# Patient Record
Sex: Female | Born: 1935
Health system: Southern US, Community
[De-identification: ages and names within clinical notes are randomized; demographics above are authoritative.]

## PROBLEM LIST (undated history)

## (undated) DIAGNOSIS — I2510 Atherosclerotic heart disease of native coronary artery without angina pectoris: Secondary | ICD-10-CM

## (undated) DIAGNOSIS — K219 Gastro-esophageal reflux disease without esophagitis: Secondary | ICD-10-CM

## (undated) DIAGNOSIS — I10 Essential (primary) hypertension: Secondary | ICD-10-CM

## (undated) DIAGNOSIS — I4891 Unspecified atrial fibrillation: Secondary | ICD-10-CM

## (undated) DIAGNOSIS — Z8719 Personal history of other diseases of the digestive system: Secondary | ICD-10-CM

## (undated) DIAGNOSIS — E785 Hyperlipidemia, unspecified: Secondary | ICD-10-CM

## (undated) DIAGNOSIS — I509 Heart failure, unspecified: Secondary | ICD-10-CM

## (undated) DIAGNOSIS — Z8673 Personal history of transient ischemic attack (TIA), and cerebral infarction without residual deficits: Secondary | ICD-10-CM

## (undated) DIAGNOSIS — I219 Acute myocardial infarction, unspecified: Secondary | ICD-10-CM

## (undated) HISTORY — PX: JOINT REPLACEMENT: SHX530

## (undated) HISTORY — PX: COLONOSCOPY WITH ESOPHAGOGASTRODUODENOSCOPY (EGD): SHX5779

## (undated) HISTORY — PX: CORONARY ANGIOPLASTY WITH STENT PLACEMENT: SHX49

---

## 2000-09-23 ENCOUNTER — Encounter: Payer: Self-pay | Admitting: Cardiology

## 2000-09-23 ENCOUNTER — Inpatient Hospital Stay (HOSPITAL_COMMUNITY): Admission: EM | Admit: 2000-09-23 | Discharge: 2000-09-25 | Payer: Self-pay | Admitting: Emergency Medicine

## 2000-09-24 ENCOUNTER — Encounter: Payer: Self-pay | Admitting: Cardiology

## 2000-09-25 ENCOUNTER — Encounter: Payer: Self-pay | Admitting: Cardiology

## 2002-07-09 ENCOUNTER — Encounter: Payer: Self-pay | Admitting: Orthopedic Surgery

## 2002-07-15 ENCOUNTER — Inpatient Hospital Stay (HOSPITAL_COMMUNITY): Admission: RE | Admit: 2002-07-15 | Discharge: 2002-07-18 | Payer: Self-pay | Admitting: Orthopedic Surgery

## 2002-08-07 HISTORY — PX: FOOT SURGERY: SHX648

## 2005-08-07 HISTORY — PX: EYE SURGERY: SHX253

## 2011-08-09 DIAGNOSIS — K222 Esophageal obstruction: Secondary | ICD-10-CM | POA: Diagnosis not present

## 2011-08-09 DIAGNOSIS — Z86010 Personal history of colonic polyps: Secondary | ICD-10-CM | POA: Diagnosis not present

## 2011-08-10 DIAGNOSIS — M9981 Other biomechanical lesions of cervical region: Secondary | ICD-10-CM | POA: Diagnosis not present

## 2011-08-10 DIAGNOSIS — M999 Biomechanical lesion, unspecified: Secondary | ICD-10-CM | POA: Diagnosis not present

## 2011-08-10 DIAGNOSIS — M5137 Other intervertebral disc degeneration, lumbosacral region: Secondary | ICD-10-CM | POA: Diagnosis not present

## 2011-08-15 DIAGNOSIS — Z800 Family history of malignant neoplasm of digestive organs: Secondary | ICD-10-CM | POA: Diagnosis not present

## 2011-08-15 DIAGNOSIS — Z8719 Personal history of other diseases of the digestive system: Secondary | ICD-10-CM | POA: Diagnosis not present

## 2011-08-15 DIAGNOSIS — E7800 Pure hypercholesterolemia, unspecified: Secondary | ICD-10-CM | POA: Diagnosis not present

## 2011-08-15 DIAGNOSIS — K449 Diaphragmatic hernia without obstruction or gangrene: Secondary | ICD-10-CM | POA: Diagnosis not present

## 2011-08-15 DIAGNOSIS — K2970 Gastritis, unspecified, without bleeding: Secondary | ICD-10-CM | POA: Diagnosis not present

## 2011-08-15 DIAGNOSIS — K2960 Other gastritis without bleeding: Secondary | ICD-10-CM | POA: Diagnosis not present

## 2011-08-15 DIAGNOSIS — Z862 Personal history of diseases of the blood and blood-forming organs and certain disorders involving the immune mechanism: Secondary | ICD-10-CM | POA: Diagnosis not present

## 2011-08-15 DIAGNOSIS — K219 Gastro-esophageal reflux disease without esophagitis: Secondary | ICD-10-CM | POA: Diagnosis not present

## 2011-08-15 DIAGNOSIS — K222 Esophageal obstruction: Secondary | ICD-10-CM | POA: Diagnosis not present

## 2011-08-15 DIAGNOSIS — Z7982 Long term (current) use of aspirin: Secondary | ICD-10-CM | POA: Diagnosis not present

## 2011-08-15 DIAGNOSIS — I10 Essential (primary) hypertension: Secondary | ICD-10-CM | POA: Diagnosis not present

## 2011-08-15 DIAGNOSIS — Z79899 Other long term (current) drug therapy: Secondary | ICD-10-CM | POA: Diagnosis not present

## 2011-08-15 DIAGNOSIS — R1310 Dysphagia, unspecified: Secondary | ICD-10-CM | POA: Diagnosis not present

## 2011-08-15 DIAGNOSIS — I252 Old myocardial infarction: Secondary | ICD-10-CM | POA: Diagnosis not present

## 2011-08-31 DIAGNOSIS — E7800 Pure hypercholesterolemia, unspecified: Secondary | ICD-10-CM | POA: Diagnosis not present

## 2011-08-31 DIAGNOSIS — R300 Dysuria: Secondary | ICD-10-CM | POA: Diagnosis not present

## 2011-08-31 DIAGNOSIS — I10 Essential (primary) hypertension: Secondary | ICD-10-CM | POA: Diagnosis not present

## 2011-08-31 DIAGNOSIS — M899 Disorder of bone, unspecified: Secondary | ICD-10-CM | POA: Diagnosis not present

## 2011-09-07 DIAGNOSIS — H43399 Other vitreous opacities, unspecified eye: Secondary | ICD-10-CM | POA: Diagnosis not present

## 2011-09-07 DIAGNOSIS — H52229 Regular astigmatism, unspecified eye: Secondary | ICD-10-CM | POA: Diagnosis not present

## 2011-09-07 DIAGNOSIS — H5200 Hypermetropia, unspecified eye: Secondary | ICD-10-CM | POA: Diagnosis not present

## 2011-09-07 DIAGNOSIS — H524 Presbyopia: Secondary | ICD-10-CM | POA: Diagnosis not present

## 2011-09-12 DIAGNOSIS — S058X9A Other injuries of unspecified eye and orbit, initial encounter: Secondary | ICD-10-CM | POA: Diagnosis not present

## 2011-09-12 DIAGNOSIS — H16129 Filamentary keratitis, unspecified eye: Secondary | ICD-10-CM | POA: Diagnosis not present

## 2011-09-13 DIAGNOSIS — M9981 Other biomechanical lesions of cervical region: Secondary | ICD-10-CM | POA: Diagnosis not present

## 2011-09-13 DIAGNOSIS — M5137 Other intervertebral disc degeneration, lumbosacral region: Secondary | ICD-10-CM | POA: Diagnosis not present

## 2011-09-13 DIAGNOSIS — M999 Biomechanical lesion, unspecified: Secondary | ICD-10-CM | POA: Diagnosis not present

## 2011-09-15 DIAGNOSIS — H1840 Unspecified corneal degeneration: Secondary | ICD-10-CM | POA: Diagnosis not present

## 2011-09-15 DIAGNOSIS — S058X9A Other injuries of unspecified eye and orbit, initial encounter: Secondary | ICD-10-CM | POA: Diagnosis not present

## 2011-09-22 DIAGNOSIS — S058X9A Other injuries of unspecified eye and orbit, initial encounter: Secondary | ICD-10-CM | POA: Diagnosis not present

## 2011-09-22 DIAGNOSIS — H1840 Unspecified corneal degeneration: Secondary | ICD-10-CM | POA: Diagnosis not present

## 2011-09-29 DIAGNOSIS — H18239 Secondary corneal edema, unspecified eye: Secondary | ICD-10-CM | POA: Diagnosis not present

## 2011-10-06 DIAGNOSIS — H1840 Unspecified corneal degeneration: Secondary | ICD-10-CM | POA: Diagnosis not present

## 2011-10-11 DIAGNOSIS — M9981 Other biomechanical lesions of cervical region: Secondary | ICD-10-CM | POA: Diagnosis not present

## 2011-10-11 DIAGNOSIS — M999 Biomechanical lesion, unspecified: Secondary | ICD-10-CM | POA: Diagnosis not present

## 2011-10-11 DIAGNOSIS — M5137 Other intervertebral disc degeneration, lumbosacral region: Secondary | ICD-10-CM | POA: Diagnosis not present

## 2011-10-20 DIAGNOSIS — H18469 Peripheral corneal degeneration, unspecified eye: Secondary | ICD-10-CM | POA: Diagnosis not present

## 2011-10-27 DIAGNOSIS — H18469 Peripheral corneal degeneration, unspecified eye: Secondary | ICD-10-CM | POA: Diagnosis not present

## 2011-11-03 DIAGNOSIS — H18469 Peripheral corneal degeneration, unspecified eye: Secondary | ICD-10-CM | POA: Diagnosis not present

## 2011-11-08 DIAGNOSIS — M999 Biomechanical lesion, unspecified: Secondary | ICD-10-CM | POA: Diagnosis not present

## 2011-11-08 DIAGNOSIS — M5137 Other intervertebral disc degeneration, lumbosacral region: Secondary | ICD-10-CM | POA: Diagnosis not present

## 2011-11-08 DIAGNOSIS — M9981 Other biomechanical lesions of cervical region: Secondary | ICD-10-CM | POA: Diagnosis not present

## 2011-11-10 DIAGNOSIS — H18469 Peripheral corneal degeneration, unspecified eye: Secondary | ICD-10-CM | POA: Diagnosis not present

## 2011-11-15 DIAGNOSIS — M999 Biomechanical lesion, unspecified: Secondary | ICD-10-CM | POA: Diagnosis not present

## 2011-11-15 DIAGNOSIS — M9981 Other biomechanical lesions of cervical region: Secondary | ICD-10-CM | POA: Diagnosis not present

## 2011-11-15 DIAGNOSIS — M5137 Other intervertebral disc degeneration, lumbosacral region: Secondary | ICD-10-CM | POA: Diagnosis not present

## 2011-11-16 DIAGNOSIS — M9981 Other biomechanical lesions of cervical region: Secondary | ICD-10-CM | POA: Diagnosis not present

## 2011-11-16 DIAGNOSIS — M999 Biomechanical lesion, unspecified: Secondary | ICD-10-CM | POA: Diagnosis not present

## 2011-11-16 DIAGNOSIS — M5137 Other intervertebral disc degeneration, lumbosacral region: Secondary | ICD-10-CM | POA: Diagnosis not present

## 2011-11-17 DIAGNOSIS — H18469 Peripheral corneal degeneration, unspecified eye: Secondary | ICD-10-CM | POA: Diagnosis not present

## 2011-11-22 DIAGNOSIS — M5137 Other intervertebral disc degeneration, lumbosacral region: Secondary | ICD-10-CM | POA: Diagnosis not present

## 2011-11-22 DIAGNOSIS — M9981 Other biomechanical lesions of cervical region: Secondary | ICD-10-CM | POA: Diagnosis not present

## 2011-11-22 DIAGNOSIS — M999 Biomechanical lesion, unspecified: Secondary | ICD-10-CM | POA: Diagnosis not present

## 2011-11-23 DIAGNOSIS — H16019 Central corneal ulcer, unspecified eye: Secondary | ICD-10-CM | POA: Diagnosis not present

## 2011-11-23 DIAGNOSIS — H18519 Endothelial corneal dystrophy, unspecified eye: Secondary | ICD-10-CM | POA: Diagnosis not present

## 2011-12-04 DIAGNOSIS — H16019 Central corneal ulcer, unspecified eye: Secondary | ICD-10-CM | POA: Diagnosis not present

## 2011-12-15 DIAGNOSIS — I739 Peripheral vascular disease, unspecified: Secondary | ICD-10-CM | POA: Diagnosis not present

## 2011-12-15 DIAGNOSIS — M25519 Pain in unspecified shoulder: Secondary | ICD-10-CM | POA: Diagnosis not present

## 2011-12-18 DIAGNOSIS — H16019 Central corneal ulcer, unspecified eye: Secondary | ICD-10-CM | POA: Diagnosis not present

## 2011-12-26 DIAGNOSIS — IMO0002 Reserved for concepts with insufficient information to code with codable children: Secondary | ICD-10-CM | POA: Diagnosis not present

## 2011-12-26 DIAGNOSIS — M4310 Spondylolisthesis, site unspecified: Secondary | ICD-10-CM | POA: Diagnosis not present

## 2011-12-29 DIAGNOSIS — Z6832 Body mass index (BMI) 32.0-32.9, adult: Secondary | ICD-10-CM | POA: Diagnosis not present

## 2011-12-29 DIAGNOSIS — M25519 Pain in unspecified shoulder: Secondary | ICD-10-CM | POA: Diagnosis not present

## 2011-12-29 DIAGNOSIS — M25569 Pain in unspecified knee: Secondary | ICD-10-CM | POA: Diagnosis not present

## 2012-01-02 DIAGNOSIS — M47817 Spondylosis without myelopathy or radiculopathy, lumbosacral region: Secondary | ICD-10-CM | POA: Diagnosis not present

## 2012-01-02 DIAGNOSIS — Q762 Congenital spondylolisthesis: Secondary | ICD-10-CM | POA: Diagnosis not present

## 2012-01-02 DIAGNOSIS — IMO0002 Reserved for concepts with insufficient information to code with codable children: Secondary | ICD-10-CM | POA: Diagnosis not present

## 2012-01-02 DIAGNOSIS — M48061 Spinal stenosis, lumbar region without neurogenic claudication: Secondary | ICD-10-CM | POA: Diagnosis not present

## 2012-01-03 DIAGNOSIS — H16049 Marginal corneal ulcer, unspecified eye: Secondary | ICD-10-CM | POA: Diagnosis not present

## 2012-01-08 DIAGNOSIS — M48061 Spinal stenosis, lumbar region without neurogenic claudication: Secondary | ICD-10-CM | POA: Diagnosis not present

## 2012-01-26 DIAGNOSIS — M545 Low back pain, unspecified: Secondary | ICD-10-CM | POA: Diagnosis not present

## 2012-01-26 DIAGNOSIS — M4310 Spondylolisthesis, site unspecified: Secondary | ICD-10-CM | POA: Diagnosis not present

## 2012-01-26 DIAGNOSIS — IMO0002 Reserved for concepts with insufficient information to code with codable children: Secondary | ICD-10-CM | POA: Diagnosis not present

## 2012-02-12 DIAGNOSIS — IMO0002 Reserved for concepts with insufficient information to code with codable children: Secondary | ICD-10-CM | POA: Diagnosis not present

## 2012-02-12 DIAGNOSIS — M4310 Spondylolisthesis, site unspecified: Secondary | ICD-10-CM | POA: Diagnosis not present

## 2012-02-12 DIAGNOSIS — M48061 Spinal stenosis, lumbar region without neurogenic claudication: Secondary | ICD-10-CM | POA: Diagnosis not present

## 2012-02-26 DIAGNOSIS — I252 Old myocardial infarction: Secondary | ICD-10-CM | POA: Diagnosis not present

## 2012-02-26 DIAGNOSIS — R471 Dysarthria and anarthria: Secondary | ICD-10-CM | POA: Diagnosis not present

## 2012-02-26 DIAGNOSIS — I10 Essential (primary) hypertension: Secondary | ICD-10-CM | POA: Diagnosis not present

## 2012-02-26 DIAGNOSIS — G459 Transient cerebral ischemic attack, unspecified: Secondary | ICD-10-CM | POA: Diagnosis not present

## 2012-02-27 DIAGNOSIS — G459 Transient cerebral ischemic attack, unspecified: Secondary | ICD-10-CM | POA: Diagnosis not present

## 2012-03-01 DIAGNOSIS — Z6832 Body mass index (BMI) 32.0-32.9, adult: Secondary | ICD-10-CM | POA: Diagnosis not present

## 2012-03-01 DIAGNOSIS — M75120 Complete rotator cuff tear or rupture of unspecified shoulder, not specified as traumatic: Secondary | ICD-10-CM | POA: Diagnosis not present

## 2012-03-01 DIAGNOSIS — M25519 Pain in unspecified shoulder: Secondary | ICD-10-CM | POA: Diagnosis not present

## 2012-03-04 DIAGNOSIS — M25519 Pain in unspecified shoulder: Secondary | ICD-10-CM | POA: Diagnosis not present

## 2012-03-06 DIAGNOSIS — I4891 Unspecified atrial fibrillation: Secondary | ICD-10-CM | POA: Diagnosis not present

## 2012-03-07 DIAGNOSIS — M25519 Pain in unspecified shoulder: Secondary | ICD-10-CM | POA: Diagnosis not present

## 2012-03-11 DIAGNOSIS — M25519 Pain in unspecified shoulder: Secondary | ICD-10-CM | POA: Diagnosis not present

## 2012-03-12 DIAGNOSIS — R0609 Other forms of dyspnea: Secondary | ICD-10-CM | POA: Diagnosis not present

## 2012-03-12 DIAGNOSIS — R0989 Other specified symptoms and signs involving the circulatory and respiratory systems: Secondary | ICD-10-CM | POA: Diagnosis not present

## 2012-03-12 DIAGNOSIS — R609 Edema, unspecified: Secondary | ICD-10-CM | POA: Diagnosis not present

## 2012-03-27 DIAGNOSIS — I509 Heart failure, unspecified: Secondary | ICD-10-CM | POA: Diagnosis not present

## 2012-03-27 DIAGNOSIS — I4891 Unspecified atrial fibrillation: Secondary | ICD-10-CM | POA: Diagnosis not present

## 2012-03-27 DIAGNOSIS — I491 Atrial premature depolarization: Secondary | ICD-10-CM | POA: Diagnosis not present

## 2012-03-27 DIAGNOSIS — I252 Old myocardial infarction: Secondary | ICD-10-CM | POA: Diagnosis not present

## 2012-04-05 DIAGNOSIS — M25519 Pain in unspecified shoulder: Secondary | ICD-10-CM | POA: Diagnosis not present

## 2012-04-05 DIAGNOSIS — M75120 Complete rotator cuff tear or rupture of unspecified shoulder, not specified as traumatic: Secondary | ICD-10-CM | POA: Diagnosis not present

## 2012-04-05 DIAGNOSIS — I4891 Unspecified atrial fibrillation: Secondary | ICD-10-CM | POA: Diagnosis not present

## 2012-04-23 DIAGNOSIS — I10 Essential (primary) hypertension: Secondary | ICD-10-CM | POA: Diagnosis not present

## 2012-04-23 DIAGNOSIS — I359 Nonrheumatic aortic valve disorder, unspecified: Secondary | ICD-10-CM | POA: Diagnosis not present

## 2012-04-23 DIAGNOSIS — G459 Transient cerebral ischemic attack, unspecified: Secondary | ICD-10-CM | POA: Diagnosis not present

## 2012-04-23 DIAGNOSIS — I509 Heart failure, unspecified: Secondary | ICD-10-CM | POA: Diagnosis not present

## 2012-04-25 DIAGNOSIS — I509 Heart failure, unspecified: Secondary | ICD-10-CM | POA: Diagnosis not present

## 2012-04-25 DIAGNOSIS — R0602 Shortness of breath: Secondary | ICD-10-CM | POA: Diagnosis not present

## 2012-04-25 DIAGNOSIS — I2510 Atherosclerotic heart disease of native coronary artery without angina pectoris: Secondary | ICD-10-CM | POA: Diagnosis not present

## 2012-04-25 DIAGNOSIS — I4891 Unspecified atrial fibrillation: Secondary | ICD-10-CM | POA: Diagnosis not present

## 2012-05-06 DIAGNOSIS — I2510 Atherosclerotic heart disease of native coronary artery without angina pectoris: Secondary | ICD-10-CM | POA: Diagnosis not present

## 2012-05-06 DIAGNOSIS — I4891 Unspecified atrial fibrillation: Secondary | ICD-10-CM | POA: Diagnosis not present

## 2012-05-06 DIAGNOSIS — R609 Edema, unspecified: Secondary | ICD-10-CM | POA: Diagnosis not present

## 2012-05-06 DIAGNOSIS — I509 Heart failure, unspecified: Secondary | ICD-10-CM | POA: Diagnosis not present

## 2012-05-07 DIAGNOSIS — R0602 Shortness of breath: Secondary | ICD-10-CM | POA: Diagnosis not present

## 2012-05-07 DIAGNOSIS — I2510 Atherosclerotic heart disease of native coronary artery without angina pectoris: Secondary | ICD-10-CM | POA: Diagnosis not present

## 2012-05-07 DIAGNOSIS — I509 Heart failure, unspecified: Secondary | ICD-10-CM | POA: Diagnosis not present

## 2012-05-16 DIAGNOSIS — R5381 Other malaise: Secondary | ICD-10-CM | POA: Diagnosis not present

## 2012-05-16 DIAGNOSIS — R5383 Other fatigue: Secondary | ICD-10-CM | POA: Diagnosis not present

## 2012-05-22 DIAGNOSIS — M75120 Complete rotator cuff tear or rupture of unspecified shoulder, not specified as traumatic: Secondary | ICD-10-CM | POA: Diagnosis not present

## 2012-06-11 DIAGNOSIS — M75120 Complete rotator cuff tear or rupture of unspecified shoulder, not specified as traumatic: Secondary | ICD-10-CM | POA: Diagnosis not present

## 2012-06-11 DIAGNOSIS — M25519 Pain in unspecified shoulder: Secondary | ICD-10-CM | POA: Diagnosis not present

## 2012-06-13 DIAGNOSIS — M75120 Complete rotator cuff tear or rupture of unspecified shoulder, not specified as traumatic: Secondary | ICD-10-CM | POA: Diagnosis not present

## 2012-06-13 DIAGNOSIS — M25519 Pain in unspecified shoulder: Secondary | ICD-10-CM | POA: Diagnosis not present

## 2012-06-18 DIAGNOSIS — M75120 Complete rotator cuff tear or rupture of unspecified shoulder, not specified as traumatic: Secondary | ICD-10-CM | POA: Diagnosis not present

## 2012-06-18 DIAGNOSIS — M25519 Pain in unspecified shoulder: Secondary | ICD-10-CM | POA: Diagnosis not present

## 2012-06-20 DIAGNOSIS — M25519 Pain in unspecified shoulder: Secondary | ICD-10-CM | POA: Diagnosis not present

## 2012-06-20 DIAGNOSIS — M75120 Complete rotator cuff tear or rupture of unspecified shoulder, not specified as traumatic: Secondary | ICD-10-CM | POA: Diagnosis not present

## 2012-06-25 DIAGNOSIS — L578 Other skin changes due to chronic exposure to nonionizing radiation: Secondary | ICD-10-CM | POA: Diagnosis not present

## 2012-06-25 DIAGNOSIS — M75120 Complete rotator cuff tear or rupture of unspecified shoulder, not specified as traumatic: Secondary | ICD-10-CM | POA: Diagnosis not present

## 2012-06-25 DIAGNOSIS — M25519 Pain in unspecified shoulder: Secondary | ICD-10-CM | POA: Diagnosis not present

## 2012-06-25 DIAGNOSIS — L821 Other seborrheic keratosis: Secondary | ICD-10-CM | POA: Diagnosis not present

## 2012-06-27 DIAGNOSIS — M25519 Pain in unspecified shoulder: Secondary | ICD-10-CM | POA: Diagnosis not present

## 2012-06-27 DIAGNOSIS — M75120 Complete rotator cuff tear or rupture of unspecified shoulder, not specified as traumatic: Secondary | ICD-10-CM | POA: Diagnosis not present

## 2012-07-02 DIAGNOSIS — M25519 Pain in unspecified shoulder: Secondary | ICD-10-CM | POA: Diagnosis not present

## 2012-07-02 DIAGNOSIS — M75120 Complete rotator cuff tear or rupture of unspecified shoulder, not specified as traumatic: Secondary | ICD-10-CM | POA: Diagnosis not present

## 2012-07-09 DIAGNOSIS — M75120 Complete rotator cuff tear or rupture of unspecified shoulder, not specified as traumatic: Secondary | ICD-10-CM | POA: Diagnosis not present

## 2012-07-09 DIAGNOSIS — M25519 Pain in unspecified shoulder: Secondary | ICD-10-CM | POA: Diagnosis not present

## 2012-07-11 DIAGNOSIS — M75120 Complete rotator cuff tear or rupture of unspecified shoulder, not specified as traumatic: Secondary | ICD-10-CM | POA: Diagnosis not present

## 2012-07-11 DIAGNOSIS — M25519 Pain in unspecified shoulder: Secondary | ICD-10-CM | POA: Diagnosis not present

## 2012-08-09 DIAGNOSIS — R300 Dysuria: Secondary | ICD-10-CM | POA: Diagnosis not present

## 2012-08-09 DIAGNOSIS — Z79899 Other long term (current) drug therapy: Secondary | ICD-10-CM | POA: Diagnosis not present

## 2012-08-09 DIAGNOSIS — I10 Essential (primary) hypertension: Secondary | ICD-10-CM | POA: Diagnosis not present

## 2012-08-10 DIAGNOSIS — K219 Gastro-esophageal reflux disease without esophagitis: Secondary | ICD-10-CM | POA: Diagnosis not present

## 2012-08-10 DIAGNOSIS — I4891 Unspecified atrial fibrillation: Secondary | ICD-10-CM | POA: Diagnosis not present

## 2012-08-10 DIAGNOSIS — R0602 Shortness of breath: Secondary | ICD-10-CM | POA: Diagnosis not present

## 2012-08-10 DIAGNOSIS — R5383 Other fatigue: Secondary | ICD-10-CM | POA: Diagnosis not present

## 2012-08-10 DIAGNOSIS — E7800 Pure hypercholesterolemia, unspecified: Secondary | ICD-10-CM | POA: Diagnosis not present

## 2012-08-10 DIAGNOSIS — Z8673 Personal history of transient ischemic attack (TIA), and cerebral infarction without residual deficits: Secondary | ICD-10-CM | POA: Diagnosis not present

## 2012-08-10 DIAGNOSIS — I10 Essential (primary) hypertension: Secondary | ICD-10-CM | POA: Diagnosis not present

## 2012-08-10 DIAGNOSIS — R5381 Other malaise: Secondary | ICD-10-CM | POA: Diagnosis not present

## 2012-08-10 DIAGNOSIS — I252 Old myocardial infarction: Secondary | ICD-10-CM | POA: Diagnosis not present

## 2012-08-10 DIAGNOSIS — D649 Anemia, unspecified: Secondary | ICD-10-CM | POA: Diagnosis not present

## 2012-08-12 DIAGNOSIS — D649 Anemia, unspecified: Secondary | ICD-10-CM | POA: Diagnosis not present

## 2012-08-12 DIAGNOSIS — I959 Hypotension, unspecified: Secondary | ICD-10-CM | POA: Diagnosis not present

## 2012-08-12 DIAGNOSIS — I10 Essential (primary) hypertension: Secondary | ICD-10-CM | POA: Diagnosis not present

## 2012-08-12 DIAGNOSIS — I4891 Unspecified atrial fibrillation: Secondary | ICD-10-CM | POA: Diagnosis not present

## 2012-08-12 DIAGNOSIS — I509 Heart failure, unspecified: Secondary | ICD-10-CM | POA: Diagnosis not present

## 2012-08-12 DIAGNOSIS — I2510 Atherosclerotic heart disease of native coronary artery without angina pectoris: Secondary | ICD-10-CM | POA: Diagnosis not present

## 2012-08-14 DIAGNOSIS — D649 Anemia, unspecified: Secondary | ICD-10-CM | POA: Diagnosis not present

## 2012-08-26 DIAGNOSIS — K222 Esophageal obstruction: Secondary | ICD-10-CM | POA: Diagnosis not present

## 2012-08-26 DIAGNOSIS — D500 Iron deficiency anemia secondary to blood loss (chronic): Secondary | ICD-10-CM | POA: Diagnosis not present

## 2012-08-28 DIAGNOSIS — D539 Nutritional anemia, unspecified: Secondary | ICD-10-CM | POA: Diagnosis not present

## 2012-09-05 DIAGNOSIS — D539 Nutritional anemia, unspecified: Secondary | ICD-10-CM | POA: Diagnosis not present

## 2012-09-25 DIAGNOSIS — K222 Esophageal obstruction: Secondary | ICD-10-CM | POA: Diagnosis not present

## 2012-10-21 DIAGNOSIS — H5200 Hypermetropia, unspecified eye: Secondary | ICD-10-CM | POA: Diagnosis not present

## 2012-10-21 DIAGNOSIS — H04129 Dry eye syndrome of unspecified lacrimal gland: Secondary | ICD-10-CM | POA: Diagnosis not present

## 2012-10-21 DIAGNOSIS — H524 Presbyopia: Secondary | ICD-10-CM | POA: Diagnosis not present

## 2012-10-21 DIAGNOSIS — H52229 Regular astigmatism, unspecified eye: Secondary | ICD-10-CM | POA: Diagnosis not present

## 2012-10-29 DIAGNOSIS — R5383 Other fatigue: Secondary | ICD-10-CM | POA: Diagnosis not present

## 2012-10-29 DIAGNOSIS — R5381 Other malaise: Secondary | ICD-10-CM | POA: Diagnosis not present

## 2012-10-29 DIAGNOSIS — D539 Nutritional anemia, unspecified: Secondary | ICD-10-CM | POA: Diagnosis not present

## 2012-10-30 DIAGNOSIS — D539 Nutritional anemia, unspecified: Secondary | ICD-10-CM | POA: Diagnosis not present

## 2012-10-31 DIAGNOSIS — D539 Nutritional anemia, unspecified: Secondary | ICD-10-CM | POA: Diagnosis not present

## 2012-11-08 DIAGNOSIS — I10 Essential (primary) hypertension: Secondary | ICD-10-CM | POA: Diagnosis not present

## 2012-11-13 DIAGNOSIS — Z1211 Encounter for screening for malignant neoplasm of colon: Secondary | ICD-10-CM | POA: Diagnosis not present

## 2012-11-19 DIAGNOSIS — I5030 Unspecified diastolic (congestive) heart failure: Secondary | ICD-10-CM | POA: Diagnosis not present

## 2012-11-19 DIAGNOSIS — I10 Essential (primary) hypertension: Secondary | ICD-10-CM | POA: Diagnosis not present

## 2012-11-19 DIAGNOSIS — D649 Anemia, unspecified: Secondary | ICD-10-CM | POA: Diagnosis not present

## 2012-11-19 DIAGNOSIS — I2510 Atherosclerotic heart disease of native coronary artery without angina pectoris: Secondary | ICD-10-CM | POA: Diagnosis not present

## 2012-11-19 DIAGNOSIS — I4891 Unspecified atrial fibrillation: Secondary | ICD-10-CM | POA: Diagnosis not present

## 2012-11-19 DIAGNOSIS — I509 Heart failure, unspecified: Secondary | ICD-10-CM | POA: Diagnosis not present

## 2012-11-20 DIAGNOSIS — K921 Melena: Secondary | ICD-10-CM | POA: Diagnosis not present

## 2012-11-20 DIAGNOSIS — D649 Anemia, unspecified: Secondary | ICD-10-CM | POA: Diagnosis not present

## 2012-11-20 DIAGNOSIS — K222 Esophageal obstruction: Secondary | ICD-10-CM | POA: Diagnosis not present

## 2012-12-20 DIAGNOSIS — K644 Residual hemorrhoidal skin tags: Secondary | ICD-10-CM | POA: Diagnosis not present

## 2012-12-20 DIAGNOSIS — K222 Esophageal obstruction: Secondary | ICD-10-CM | POA: Diagnosis not present

## 2012-12-20 DIAGNOSIS — D131 Benign neoplasm of stomach: Secondary | ICD-10-CM | POA: Diagnosis not present

## 2012-12-20 DIAGNOSIS — I10 Essential (primary) hypertension: Secondary | ICD-10-CM | POA: Diagnosis not present

## 2012-12-20 DIAGNOSIS — D500 Iron deficiency anemia secondary to blood loss (chronic): Secondary | ICD-10-CM | POA: Diagnosis not present

## 2012-12-20 DIAGNOSIS — K2960 Other gastritis without bleeding: Secondary | ICD-10-CM | POA: Diagnosis not present

## 2012-12-20 DIAGNOSIS — D509 Iron deficiency anemia, unspecified: Secondary | ICD-10-CM | POA: Diagnosis not present

## 2012-12-20 DIAGNOSIS — Z800 Family history of malignant neoplasm of digestive organs: Secondary | ICD-10-CM | POA: Diagnosis not present

## 2012-12-20 DIAGNOSIS — D649 Anemia, unspecified: Secondary | ICD-10-CM | POA: Diagnosis not present

## 2012-12-20 DIAGNOSIS — R1310 Dysphagia, unspecified: Secondary | ICD-10-CM | POA: Diagnosis not present

## 2012-12-20 DIAGNOSIS — E7800 Pure hypercholesterolemia, unspecified: Secondary | ICD-10-CM | POA: Diagnosis not present

## 2012-12-20 DIAGNOSIS — Z8673 Personal history of transient ischemic attack (TIA), and cerebral infarction without residual deficits: Secondary | ICD-10-CM | POA: Diagnosis not present

## 2012-12-20 DIAGNOSIS — K449 Diaphragmatic hernia without obstruction or gangrene: Secondary | ICD-10-CM | POA: Diagnosis not present

## 2012-12-20 DIAGNOSIS — Z79899 Other long term (current) drug therapy: Secondary | ICD-10-CM | POA: Diagnosis not present

## 2012-12-20 DIAGNOSIS — I252 Old myocardial infarction: Secondary | ICD-10-CM | POA: Diagnosis not present

## 2013-01-14 DIAGNOSIS — D649 Anemia, unspecified: Secondary | ICD-10-CM | POA: Diagnosis not present

## 2013-01-20 DIAGNOSIS — K222 Esophageal obstruction: Secondary | ICD-10-CM | POA: Diagnosis not present

## 2013-02-12 DIAGNOSIS — M9981 Other biomechanical lesions of cervical region: Secondary | ICD-10-CM | POA: Diagnosis not present

## 2013-02-12 DIAGNOSIS — M999 Biomechanical lesion, unspecified: Secondary | ICD-10-CM | POA: Diagnosis not present

## 2013-02-12 DIAGNOSIS — M5137 Other intervertebral disc degeneration, lumbosacral region: Secondary | ICD-10-CM | POA: Diagnosis not present

## 2013-02-13 DIAGNOSIS — M9981 Other biomechanical lesions of cervical region: Secondary | ICD-10-CM | POA: Diagnosis not present

## 2013-02-13 DIAGNOSIS — M5137 Other intervertebral disc degeneration, lumbosacral region: Secondary | ICD-10-CM | POA: Diagnosis not present

## 2013-02-13 DIAGNOSIS — M999 Biomechanical lesion, unspecified: Secondary | ICD-10-CM | POA: Diagnosis not present

## 2013-02-19 DIAGNOSIS — M9981 Other biomechanical lesions of cervical region: Secondary | ICD-10-CM | POA: Diagnosis not present

## 2013-02-19 DIAGNOSIS — M999 Biomechanical lesion, unspecified: Secondary | ICD-10-CM | POA: Diagnosis not present

## 2013-02-19 DIAGNOSIS — M5137 Other intervertebral disc degeneration, lumbosacral region: Secondary | ICD-10-CM | POA: Diagnosis not present

## 2013-02-26 DIAGNOSIS — M5137 Other intervertebral disc degeneration, lumbosacral region: Secondary | ICD-10-CM | POA: Diagnosis not present

## 2013-02-26 DIAGNOSIS — M999 Biomechanical lesion, unspecified: Secondary | ICD-10-CM | POA: Diagnosis not present

## 2013-02-26 DIAGNOSIS — M9981 Other biomechanical lesions of cervical region: Secondary | ICD-10-CM | POA: Diagnosis not present

## 2013-03-05 DIAGNOSIS — M9981 Other biomechanical lesions of cervical region: Secondary | ICD-10-CM | POA: Diagnosis not present

## 2013-03-05 DIAGNOSIS — M5137 Other intervertebral disc degeneration, lumbosacral region: Secondary | ICD-10-CM | POA: Diagnosis not present

## 2013-03-05 DIAGNOSIS — M999 Biomechanical lesion, unspecified: Secondary | ICD-10-CM | POA: Diagnosis not present

## 2013-03-12 DIAGNOSIS — M999 Biomechanical lesion, unspecified: Secondary | ICD-10-CM | POA: Diagnosis not present

## 2013-03-12 DIAGNOSIS — M5137 Other intervertebral disc degeneration, lumbosacral region: Secondary | ICD-10-CM | POA: Diagnosis not present

## 2013-03-12 DIAGNOSIS — M9981 Other biomechanical lesions of cervical region: Secondary | ICD-10-CM | POA: Diagnosis not present

## 2013-03-17 DIAGNOSIS — M999 Biomechanical lesion, unspecified: Secondary | ICD-10-CM | POA: Diagnosis not present

## 2013-03-17 DIAGNOSIS — M9981 Other biomechanical lesions of cervical region: Secondary | ICD-10-CM | POA: Diagnosis not present

## 2013-03-17 DIAGNOSIS — M5137 Other intervertebral disc degeneration, lumbosacral region: Secondary | ICD-10-CM | POA: Diagnosis not present

## 2013-03-19 DIAGNOSIS — M5137 Other intervertebral disc degeneration, lumbosacral region: Secondary | ICD-10-CM | POA: Diagnosis not present

## 2013-03-19 DIAGNOSIS — M999 Biomechanical lesion, unspecified: Secondary | ICD-10-CM | POA: Diagnosis not present

## 2013-03-19 DIAGNOSIS — M9981 Other biomechanical lesions of cervical region: Secondary | ICD-10-CM | POA: Diagnosis not present

## 2013-03-24 DIAGNOSIS — M9981 Other biomechanical lesions of cervical region: Secondary | ICD-10-CM | POA: Diagnosis not present

## 2013-03-24 DIAGNOSIS — M5137 Other intervertebral disc degeneration, lumbosacral region: Secondary | ICD-10-CM | POA: Diagnosis not present

## 2013-03-24 DIAGNOSIS — M999 Biomechanical lesion, unspecified: Secondary | ICD-10-CM | POA: Diagnosis not present

## 2013-03-26 DIAGNOSIS — M999 Biomechanical lesion, unspecified: Secondary | ICD-10-CM | POA: Diagnosis not present

## 2013-03-26 DIAGNOSIS — M5137 Other intervertebral disc degeneration, lumbosacral region: Secondary | ICD-10-CM | POA: Diagnosis not present

## 2013-03-26 DIAGNOSIS — M9981 Other biomechanical lesions of cervical region: Secondary | ICD-10-CM | POA: Diagnosis not present

## 2013-03-27 DIAGNOSIS — D649 Anemia, unspecified: Secondary | ICD-10-CM | POA: Diagnosis not present

## 2013-03-31 DIAGNOSIS — M999 Biomechanical lesion, unspecified: Secondary | ICD-10-CM | POA: Diagnosis not present

## 2013-03-31 DIAGNOSIS — M5137 Other intervertebral disc degeneration, lumbosacral region: Secondary | ICD-10-CM | POA: Diagnosis not present

## 2013-03-31 DIAGNOSIS — M9981 Other biomechanical lesions of cervical region: Secondary | ICD-10-CM | POA: Diagnosis not present

## 2013-04-02 DIAGNOSIS — M9981 Other biomechanical lesions of cervical region: Secondary | ICD-10-CM | POA: Diagnosis not present

## 2013-04-02 DIAGNOSIS — M5137 Other intervertebral disc degeneration, lumbosacral region: Secondary | ICD-10-CM | POA: Diagnosis not present

## 2013-04-02 DIAGNOSIS — M999 Biomechanical lesion, unspecified: Secondary | ICD-10-CM | POA: Diagnosis not present

## 2013-04-09 DIAGNOSIS — M999 Biomechanical lesion, unspecified: Secondary | ICD-10-CM | POA: Diagnosis not present

## 2013-04-09 DIAGNOSIS — M9981 Other biomechanical lesions of cervical region: Secondary | ICD-10-CM | POA: Diagnosis not present

## 2013-04-09 DIAGNOSIS — M5137 Other intervertebral disc degeneration, lumbosacral region: Secondary | ICD-10-CM | POA: Diagnosis not present

## 2013-04-10 DIAGNOSIS — M9981 Other biomechanical lesions of cervical region: Secondary | ICD-10-CM | POA: Diagnosis not present

## 2013-04-10 DIAGNOSIS — M999 Biomechanical lesion, unspecified: Secondary | ICD-10-CM | POA: Diagnosis not present

## 2013-04-10 DIAGNOSIS — M5137 Other intervertebral disc degeneration, lumbosacral region: Secondary | ICD-10-CM | POA: Diagnosis not present

## 2013-04-14 DIAGNOSIS — M999 Biomechanical lesion, unspecified: Secondary | ICD-10-CM | POA: Diagnosis not present

## 2013-04-14 DIAGNOSIS — M9981 Other biomechanical lesions of cervical region: Secondary | ICD-10-CM | POA: Diagnosis not present

## 2013-04-14 DIAGNOSIS — M5137 Other intervertebral disc degeneration, lumbosacral region: Secondary | ICD-10-CM | POA: Diagnosis not present

## 2013-04-16 DIAGNOSIS — M5137 Other intervertebral disc degeneration, lumbosacral region: Secondary | ICD-10-CM | POA: Diagnosis not present

## 2013-04-16 DIAGNOSIS — M999 Biomechanical lesion, unspecified: Secondary | ICD-10-CM | POA: Diagnosis not present

## 2013-04-16 DIAGNOSIS — M9981 Other biomechanical lesions of cervical region: Secondary | ICD-10-CM | POA: Diagnosis not present

## 2013-04-21 DIAGNOSIS — M9981 Other biomechanical lesions of cervical region: Secondary | ICD-10-CM | POA: Diagnosis not present

## 2013-04-21 DIAGNOSIS — M999 Biomechanical lesion, unspecified: Secondary | ICD-10-CM | POA: Diagnosis not present

## 2013-04-21 DIAGNOSIS — M5137 Other intervertebral disc degeneration, lumbosacral region: Secondary | ICD-10-CM | POA: Diagnosis not present

## 2013-04-23 DIAGNOSIS — M5137 Other intervertebral disc degeneration, lumbosacral region: Secondary | ICD-10-CM | POA: Diagnosis not present

## 2013-04-23 DIAGNOSIS — M9981 Other biomechanical lesions of cervical region: Secondary | ICD-10-CM | POA: Diagnosis not present

## 2013-04-23 DIAGNOSIS — M999 Biomechanical lesion, unspecified: Secondary | ICD-10-CM | POA: Diagnosis not present

## 2013-04-28 DIAGNOSIS — M5137 Other intervertebral disc degeneration, lumbosacral region: Secondary | ICD-10-CM | POA: Diagnosis not present

## 2013-04-28 DIAGNOSIS — M999 Biomechanical lesion, unspecified: Secondary | ICD-10-CM | POA: Diagnosis not present

## 2013-04-28 DIAGNOSIS — M9981 Other biomechanical lesions of cervical region: Secondary | ICD-10-CM | POA: Diagnosis not present

## 2013-04-30 DIAGNOSIS — M5137 Other intervertebral disc degeneration, lumbosacral region: Secondary | ICD-10-CM | POA: Diagnosis not present

## 2013-04-30 DIAGNOSIS — M9981 Other biomechanical lesions of cervical region: Secondary | ICD-10-CM | POA: Diagnosis not present

## 2013-04-30 DIAGNOSIS — M999 Biomechanical lesion, unspecified: Secondary | ICD-10-CM | POA: Diagnosis not present

## 2013-05-05 DIAGNOSIS — M5137 Other intervertebral disc degeneration, lumbosacral region: Secondary | ICD-10-CM | POA: Diagnosis not present

## 2013-05-05 DIAGNOSIS — M999 Biomechanical lesion, unspecified: Secondary | ICD-10-CM | POA: Diagnosis not present

## 2013-05-05 DIAGNOSIS — M9981 Other biomechanical lesions of cervical region: Secondary | ICD-10-CM | POA: Diagnosis not present

## 2013-05-07 DIAGNOSIS — M999 Biomechanical lesion, unspecified: Secondary | ICD-10-CM | POA: Diagnosis not present

## 2013-05-07 DIAGNOSIS — M5137 Other intervertebral disc degeneration, lumbosacral region: Secondary | ICD-10-CM | POA: Diagnosis not present

## 2013-05-07 DIAGNOSIS — M9981 Other biomechanical lesions of cervical region: Secondary | ICD-10-CM | POA: Diagnosis not present

## 2013-05-12 DIAGNOSIS — M5137 Other intervertebral disc degeneration, lumbosacral region: Secondary | ICD-10-CM | POA: Diagnosis not present

## 2013-05-12 DIAGNOSIS — M999 Biomechanical lesion, unspecified: Secondary | ICD-10-CM | POA: Diagnosis not present

## 2013-05-12 DIAGNOSIS — M9981 Other biomechanical lesions of cervical region: Secondary | ICD-10-CM | POA: Diagnosis not present

## 2013-05-14 DIAGNOSIS — M999 Biomechanical lesion, unspecified: Secondary | ICD-10-CM | POA: Diagnosis not present

## 2013-05-14 DIAGNOSIS — M9981 Other biomechanical lesions of cervical region: Secondary | ICD-10-CM | POA: Diagnosis not present

## 2013-05-14 DIAGNOSIS — M5137 Other intervertebral disc degeneration, lumbosacral region: Secondary | ICD-10-CM | POA: Diagnosis not present

## 2013-05-19 DIAGNOSIS — M9981 Other biomechanical lesions of cervical region: Secondary | ICD-10-CM | POA: Diagnosis not present

## 2013-05-19 DIAGNOSIS — M999 Biomechanical lesion, unspecified: Secondary | ICD-10-CM | POA: Diagnosis not present

## 2013-05-19 DIAGNOSIS — M5137 Other intervertebral disc degeneration, lumbosacral region: Secondary | ICD-10-CM | POA: Diagnosis not present

## 2013-05-21 DIAGNOSIS — M9981 Other biomechanical lesions of cervical region: Secondary | ICD-10-CM | POA: Diagnosis not present

## 2013-05-21 DIAGNOSIS — M5137 Other intervertebral disc degeneration, lumbosacral region: Secondary | ICD-10-CM | POA: Diagnosis not present

## 2013-05-21 DIAGNOSIS — M999 Biomechanical lesion, unspecified: Secondary | ICD-10-CM | POA: Diagnosis not present

## 2013-05-26 DIAGNOSIS — M5137 Other intervertebral disc degeneration, lumbosacral region: Secondary | ICD-10-CM | POA: Diagnosis not present

## 2013-05-26 DIAGNOSIS — I10 Essential (primary) hypertension: Secondary | ICD-10-CM | POA: Diagnosis not present

## 2013-05-26 DIAGNOSIS — Z23 Encounter for immunization: Secondary | ICD-10-CM | POA: Diagnosis not present

## 2013-05-26 DIAGNOSIS — E7800 Pure hypercholesterolemia, unspecified: Secondary | ICD-10-CM | POA: Diagnosis not present

## 2013-05-26 DIAGNOSIS — M9981 Other biomechanical lesions of cervical region: Secondary | ICD-10-CM | POA: Diagnosis not present

## 2013-05-26 DIAGNOSIS — Z0000 Encounter for general adult medical examination without abnormal findings: Secondary | ICD-10-CM | POA: Diagnosis not present

## 2013-05-26 DIAGNOSIS — M999 Biomechanical lesion, unspecified: Secondary | ICD-10-CM | POA: Diagnosis not present

## 2013-05-26 DIAGNOSIS — R350 Frequency of micturition: Secondary | ICD-10-CM | POA: Diagnosis not present

## 2013-05-28 DIAGNOSIS — M5137 Other intervertebral disc degeneration, lumbosacral region: Secondary | ICD-10-CM | POA: Diagnosis not present

## 2013-05-28 DIAGNOSIS — M9981 Other biomechanical lesions of cervical region: Secondary | ICD-10-CM | POA: Diagnosis not present

## 2013-05-28 DIAGNOSIS — M999 Biomechanical lesion, unspecified: Secondary | ICD-10-CM | POA: Diagnosis not present

## 2013-06-02 DIAGNOSIS — M5137 Other intervertebral disc degeneration, lumbosacral region: Secondary | ICD-10-CM | POA: Diagnosis not present

## 2013-06-02 DIAGNOSIS — M9981 Other biomechanical lesions of cervical region: Secondary | ICD-10-CM | POA: Diagnosis not present

## 2013-06-02 DIAGNOSIS — M999 Biomechanical lesion, unspecified: Secondary | ICD-10-CM | POA: Diagnosis not present

## 2013-06-04 DIAGNOSIS — E785 Hyperlipidemia, unspecified: Secondary | ICD-10-CM | POA: Diagnosis not present

## 2013-06-04 DIAGNOSIS — I509 Heart failure, unspecified: Secondary | ICD-10-CM | POA: Diagnosis not present

## 2013-06-04 DIAGNOSIS — D649 Anemia, unspecified: Secondary | ICD-10-CM | POA: Diagnosis not present

## 2013-06-04 DIAGNOSIS — I110 Hypertensive heart disease with heart failure: Secondary | ICD-10-CM | POA: Diagnosis not present

## 2013-06-04 DIAGNOSIS — M9981 Other biomechanical lesions of cervical region: Secondary | ICD-10-CM | POA: Diagnosis not present

## 2013-06-04 DIAGNOSIS — M999 Biomechanical lesion, unspecified: Secondary | ICD-10-CM | POA: Diagnosis not present

## 2013-06-04 DIAGNOSIS — M5137 Other intervertebral disc degeneration, lumbosacral region: Secondary | ICD-10-CM | POA: Diagnosis not present

## 2013-06-04 DIAGNOSIS — I2510 Atherosclerotic heart disease of native coronary artery without angina pectoris: Secondary | ICD-10-CM | POA: Diagnosis not present

## 2013-06-04 DIAGNOSIS — Z7901 Long term (current) use of anticoagulants: Secondary | ICD-10-CM | POA: Diagnosis not present

## 2013-06-04 DIAGNOSIS — I4891 Unspecified atrial fibrillation: Secondary | ICD-10-CM | POA: Diagnosis not present

## 2013-06-24 DIAGNOSIS — R079 Chest pain, unspecified: Secondary | ICD-10-CM | POA: Diagnosis not present

## 2013-06-24 DIAGNOSIS — S298XXA Other specified injuries of thorax, initial encounter: Secondary | ICD-10-CM | POA: Diagnosis not present

## 2013-06-24 DIAGNOSIS — R937 Abnormal findings on diagnostic imaging of other parts of musculoskeletal system: Secondary | ICD-10-CM | POA: Diagnosis not present

## 2013-06-24 DIAGNOSIS — R071 Chest pain on breathing: Secondary | ICD-10-CM | POA: Diagnosis not present

## 2013-06-24 DIAGNOSIS — K449 Diaphragmatic hernia without obstruction or gangrene: Secondary | ICD-10-CM | POA: Diagnosis not present

## 2013-06-24 DIAGNOSIS — R0789 Other chest pain: Secondary | ICD-10-CM | POA: Diagnosis not present

## 2013-06-24 DIAGNOSIS — I700 Atherosclerosis of aorta: Secondary | ICD-10-CM | POA: Diagnosis not present

## 2013-06-24 DIAGNOSIS — S2220XA Unspecified fracture of sternum, initial encounter for closed fracture: Secondary | ICD-10-CM | POA: Diagnosis not present

## 2013-06-25 DIAGNOSIS — L578 Other skin changes due to chronic exposure to nonionizing radiation: Secondary | ICD-10-CM | POA: Diagnosis not present

## 2013-06-25 DIAGNOSIS — L570 Actinic keratosis: Secondary | ICD-10-CM | POA: Diagnosis not present

## 2013-06-25 DIAGNOSIS — C44320 Squamous cell carcinoma of skin of unspecified parts of face: Secondary | ICD-10-CM | POA: Diagnosis not present

## 2013-06-25 DIAGNOSIS — L738 Other specified follicular disorders: Secondary | ICD-10-CM | POA: Diagnosis not present

## 2013-06-25 DIAGNOSIS — L821 Other seborrheic keratosis: Secondary | ICD-10-CM | POA: Diagnosis not present

## 2013-06-25 DIAGNOSIS — R609 Edema, unspecified: Secondary | ICD-10-CM | POA: Diagnosis not present

## 2013-07-01 DIAGNOSIS — S2220XA Unspecified fracture of sternum, initial encounter for closed fracture: Secondary | ICD-10-CM | POA: Diagnosis not present

## 2013-07-07 DIAGNOSIS — M8700 Idiopathic aseptic necrosis of unspecified bone: Secondary | ICD-10-CM | POA: Diagnosis not present

## 2013-07-22 DIAGNOSIS — D649 Anemia, unspecified: Secondary | ICD-10-CM | POA: Diagnosis not present

## 2013-07-23 DIAGNOSIS — Z800 Family history of malignant neoplasm of digestive organs: Secondary | ICD-10-CM | POA: Diagnosis not present

## 2013-07-23 DIAGNOSIS — K222 Esophageal obstruction: Secondary | ICD-10-CM | POA: Diagnosis not present

## 2013-07-23 DIAGNOSIS — K219 Gastro-esophageal reflux disease without esophagitis: Secondary | ICD-10-CM | POA: Diagnosis not present

## 2013-08-12 ENCOUNTER — Encounter (HOSPITAL_COMMUNITY): Payer: Self-pay | Admitting: Pharmacy Technician

## 2013-08-14 ENCOUNTER — Other Ambulatory Visit (HOSPITAL_COMMUNITY): Payer: Self-pay | Admitting: *Deleted

## 2013-08-14 NOTE — Pre-Procedure Instructions (Signed)
Sarah Reyes  08/14/2013   Your procedure is scheduled on:  Thursday, August 21, 2013 at 7:30 AM.   Report to [facility] Entrance "A" Admitting Office at 5:30 AM.   Call this number if you have problems the morning of surgery: [phone]   Remember:   Do not eat food or drink liquids after midnight Wednesday, 08/20/13.   Take these medicines the morning of surgery with A SIP OF WATER: acetaminophen (TYLENOL), metoprolol tartrate (LOPRESSOR),  omeprazole (PRILOSEC OTC), traMADol (ULTRAM) - if needed, may use eye drops.  Stop all Vitamins, Herbal Medications and Fish Oil as of today, 08/15/13. Follow your cardiologist's instructions for the Xarelto.     Do not wear jewelry, make-up or nail polish.  Do not wear lotions, powders, or perfumes. You may wear deodorant.  Do not shave 48 hours prior to surgery.   Do not bring valuables to the hospital.  [facility] is not responsible                  for any belongings or valuables.               Contacts, dentures or bridgework may not be worn into surgery.  Leave suitcase in the car. After surgery it may be brought to your room.  For patients admitted to the hospital, discharge time is determined by your                treatment team.                Special Instructions: Shower using CHG 2 nights before surgery and the night before surgery.  If you shower the day of surgery use CHG.  Use special wash - you have one bottle of CHG for all showers.  You should use approximately 1/3 of the bottle for each shower.   Please read over the following fact sheets that you were given: Pain Booklet, Coughing and Deep Breathing, Blood Transfusion Information and Surgical Site Infection Prevention

## 2013-08-15 ENCOUNTER — Encounter (HOSPITAL_COMMUNITY)
Admission: RE | Admit: 2013-08-15 | Discharge: 2013-08-15 | Disposition: A | Discharge status: Home / Self Care | Payer: Medicare Other | Source: Ambulatory Visit | Attending: Orthopedic Surgery | Admitting: Orthopedic Surgery

## 2013-08-15 ENCOUNTER — Ambulatory Visit (HOSPITAL_COMMUNITY)
Admission: RE | Admit: 2013-08-15 | Discharge: 2013-08-15 | Disposition: A | Discharge status: Home / Self Care | Payer: Medicare Other | Source: Ambulatory Visit | Attending: Orthopedic Surgery | Admitting: Orthopedic Surgery

## 2013-08-15 ENCOUNTER — Encounter (HOSPITAL_COMMUNITY): Payer: Self-pay

## 2013-08-15 DIAGNOSIS — K219 Gastro-esophageal reflux disease without esophagitis: Secondary | ICD-10-CM | POA: Insufficient documentation

## 2013-08-15 DIAGNOSIS — I10 Essential (primary) hypertension: Secondary | ICD-10-CM | POA: Diagnosis not present

## 2013-08-15 DIAGNOSIS — E785 Hyperlipidemia, unspecified: Secondary | ICD-10-CM | POA: Diagnosis not present

## 2013-08-15 DIAGNOSIS — Z8673 Personal history of transient ischemic attack (TIA), and cerebral infarction without residual deficits: Secondary | ICD-10-CM | POA: Diagnosis not present

## 2013-08-15 DIAGNOSIS — I2510 Atherosclerotic heart disease of native coronary artery without angina pectoris: Secondary | ICD-10-CM | POA: Diagnosis not present

## 2013-08-15 DIAGNOSIS — Z01812 Encounter for preprocedural laboratory examination: Secondary | ICD-10-CM | POA: Insufficient documentation

## 2013-08-15 DIAGNOSIS — Z01818 Encounter for other preprocedural examination: Secondary | ICD-10-CM | POA: Insufficient documentation

## 2013-08-15 DIAGNOSIS — E669 Obesity, unspecified: Secondary | ICD-10-CM | POA: Insufficient documentation

## 2013-08-15 DIAGNOSIS — M439 Deforming dorsopathy, unspecified: Secondary | ICD-10-CM | POA: Insufficient documentation

## 2013-08-15 DIAGNOSIS — I252 Old myocardial infarction: Secondary | ICD-10-CM | POA: Diagnosis not present

## 2013-08-15 HISTORY — DX: Essential (primary) hypertension: I10

## 2013-08-15 HISTORY — DX: Atherosclerotic heart disease of native coronary artery without angina pectoris: I25.10

## 2013-08-15 HISTORY — DX: Personal history of transient ischemic attack (TIA), and cerebral infarction without residual deficits: Z86.73

## 2013-08-15 HISTORY — DX: Heart failure, unspecified: I50.9

## 2013-08-15 HISTORY — DX: Personal history of other diseases of the digestive system: Z87.19

## 2013-08-15 HISTORY — DX: Gastro-esophageal reflux disease without esophagitis: K21.9

## 2013-08-15 HISTORY — DX: Hyperlipidemia, unspecified: E78.5

## 2013-08-15 HISTORY — DX: Unspecified atrial fibrillation: I48.91

## 2013-08-15 HISTORY — DX: Acute myocardial infarction, unspecified: I21.9

## 2013-08-15 LAB — BASIC METABOLIC PANEL
BUN: 29 mg/dL — ABNORMAL HIGH (ref 6–23)
CHLORIDE: 104 meq/L (ref 96–112)
CO2: 23 meq/L (ref 19–32)
Calcium: 9.5 mg/dL (ref 8.4–10.5)
Creatinine, Ser: 0.93 mg/dL (ref 0.50–1.10)
GFR calc non Af Amer: 55 mL/min — ABNORMAL LOW (ref >90)
GFR, EST AFRICAN AMERICAN: 64 mL/min — AB (ref >90)
Glucose, Bld: 110 mg/dL — ABNORMAL HIGH (ref 70–99)
Potassium: 5.4 mEq/L — ABNORMAL HIGH (ref 3.7–5.3)
SODIUM: 140 meq/L (ref 137–147)

## 2013-08-15 LAB — CBC
HEMATOCRIT: 34.1 % — AB (ref 36.0–46.0)
Hemoglobin: 11.4 g/dL — ABNORMAL LOW (ref 12.0–15.0)
MCH: 32.5 pg (ref 26.0–34.0)
MCHC: 33.4 g/dL (ref 30.0–36.0)
MCV: 97.2 fL (ref 78.0–100.0)
PLATELETS: 292 10*3/uL (ref 150–400)
RBC: 3.51 MIL/uL — AB (ref 3.87–5.11)
RDW: 13.5 % (ref 11.5–15.5)
WBC: 8.1 10*3/uL (ref 4.0–10.5)

## 2013-08-15 LAB — TYPE AND SCREEN
ABO/RH(D): A NEG
Antibody Screen: NEGATIVE

## 2013-08-15 MED ORDER — CHLORHEXIDINE GLUCONATE 4 % EX LIQD: 60.0000 mL | Freq: Once | CUTANEOUS | Status: DC

## 2013-08-16 LAB — ABO/RH: ABO/RH(D): A NEG

## 2013-08-16 NOTE — Progress Notes (Signed)
Patient called concerning Xarelto. Patient stated she thought that she was told to stop it 2 days ahead of surgery. Per Cardiologist note patient to stop at least 2 days ahead. Advised patient to contact Dr. Susie Cassette office for clarification on Monday.

## 2013-08-18 ENCOUNTER — Encounter (HOSPITAL_COMMUNITY): Payer: Self-pay

## 2013-08-18 NOTE — Progress Notes (Signed)
Anesthesia Chart Review:  Patient is a 80 year old female scheduled for left shoulder reverse arthroplasty on 08/21/13 by Dr. Onnie Graham.    History includes obesity, non-smoker, HTN, GERD, HLD, CAD/MI s/p RCA stent '02, afib, CHF, TIA, gastric polyp s/p polypectomy.  Cardiologist is Dr. Shirlee More with [facility]) who said she was okay to proceed with this surgery from his standpoint with permission to hold Xarelto 48 hours preoperatively without need for Lovenox bridge. PCP has been Dr. Kennith Maes.  EKG on 06/04/13 [facility]) showed SR with first degree AVB.  Nuclear stress test on 05/07/12 [facility]) showed no perfusion defects, stress or rest.  Small hyperdynamic LV with EF calculated at 82%.  Preoperative CXR and labs noted. K+ is 5.4, Cr 0.93.  She has been cleared by her cardiologist.  She does have mild hyperkalemia, but < 5.5 with normal Cr so I did not order a recheck pre-operatively.  If no acute changes then I would anticipate that she could proceed as planned.  George Hugh [facility] Short Stay Center/Anesthesiology Phone [phone] 08/18/2013 6:36 PM

## 2013-08-20 MED ORDER — CEFAZOLIN SODIUM-DEXTROSE 2-3 GM-% IV SOLR
2.0000 g | INTRAVENOUS | Status: AC
  Administered 2013-08-21: 2 g via INTRAVENOUS
  Filled 2013-08-20: qty 50

## 2013-08-21 ENCOUNTER — Encounter (HOSPITAL_COMMUNITY)
Admission: RE | Disposition: A | Discharge status: Home / Self Care | Payer: Self-pay | Source: Ambulatory Visit | Attending: Orthopedic Surgery

## 2013-08-21 ENCOUNTER — Inpatient Hospital Stay (HOSPITAL_COMMUNITY): Payer: Medicare Other | Admitting: Anesthesiology

## 2013-08-21 ENCOUNTER — Encounter (HOSPITAL_COMMUNITY): Payer: Medicare Other | Admitting: Vascular Surgery

## 2013-08-21 ENCOUNTER — Inpatient Hospital Stay (HOSPITAL_COMMUNITY)
Admission: RE | Admit: 2013-08-21 | Discharge: 2013-08-22 | DRG: 483 | Disposition: A | Discharge status: Home / Self Care | Payer: Medicare Other | Source: Ambulatory Visit | Attending: Orthopedic Surgery | Admitting: Orthopedic Surgery

## 2013-08-21 ENCOUNTER — Encounter (HOSPITAL_COMMUNITY): Payer: Self-pay | Admitting: Surgery

## 2013-08-21 DIAGNOSIS — I4891 Unspecified atrial fibrillation: Secondary | ICD-10-CM | POA: Diagnosis not present

## 2013-08-21 DIAGNOSIS — K219 Gastro-esophageal reflux disease without esophagitis: Secondary | ICD-10-CM | POA: Diagnosis not present

## 2013-08-21 DIAGNOSIS — Z8673 Personal history of transient ischemic attack (TIA), and cerebral infarction without residual deficits: Secondary | ICD-10-CM | POA: Diagnosis not present

## 2013-08-21 DIAGNOSIS — I509 Heart failure, unspecified: Secondary | ICD-10-CM | POA: Diagnosis present

## 2013-08-21 DIAGNOSIS — E785 Hyperlipidemia, unspecified: Secondary | ICD-10-CM | POA: Diagnosis not present

## 2013-08-21 DIAGNOSIS — I2510 Atherosclerotic heart disease of native coronary artery without angina pectoris: Secondary | ICD-10-CM | POA: Diagnosis present

## 2013-08-21 DIAGNOSIS — I10 Essential (primary) hypertension: Secondary | ICD-10-CM | POA: Diagnosis present

## 2013-08-21 DIAGNOSIS — Z882 Allergy status to sulfonamides status: Secondary | ICD-10-CM | POA: Diagnosis not present

## 2013-08-21 DIAGNOSIS — M8708 Idiopathic aseptic necrosis of bone, other site: Secondary | ICD-10-CM | POA: Diagnosis not present

## 2013-08-21 DIAGNOSIS — G8918 Other acute postprocedural pain: Secondary | ICD-10-CM | POA: Diagnosis not present

## 2013-08-21 DIAGNOSIS — M19019 Primary osteoarthritis, unspecified shoulder: Secondary | ICD-10-CM | POA: Diagnosis not present

## 2013-08-21 DIAGNOSIS — Z9861 Coronary angioplasty status: Secondary | ICD-10-CM | POA: Diagnosis not present

## 2013-08-21 DIAGNOSIS — I252 Old myocardial infarction: Secondary | ICD-10-CM

## 2013-08-21 DIAGNOSIS — Z96619 Presence of unspecified artificial shoulder joint: Secondary | ICD-10-CM

## 2013-08-21 HISTORY — PX: REVERSE SHOULDER ARTHROPLASTY: SHX5054

## 2013-08-21 SURGERY — ARTHROPLASTY, SHOULDER, TOTAL, REVERSE
Anesthesia: General | Site: Shoulder | Laterality: Left

## 2013-08-21 MED ORDER — HYDROMORPHONE HCL PF 1 MG/ML IJ SOLN
0.2500 mg | INTRAMUSCULAR | Status: DC | PRN
  Administered 2013-08-21: 0.5 mg via INTRAVENOUS
  Filled 2013-08-21: qty 1

## 2013-08-21 MED ORDER — PHENYLEPHRINE HCL 10 MG/ML IJ SOLN
10.0000 mg | INTRAMUSCULAR | Status: DC | PRN
  Administered 2013-08-21: 25 ug/min via INTRAVENOUS

## 2013-08-21 MED ORDER — METOPROLOL TARTRATE 25 MG PO TABS
25.0000 mg | ORAL_TABLET | Freq: Two times a day (BID) | ORAL | Status: DC
  Administered 2013-08-21: 25 mg via ORAL
  Filled 2013-08-21 (×3): qty 1

## 2013-08-21 MED ORDER — FLEET ENEMA 7-19 GM/118ML RE ENEM: 1.0000 | ENEMA | Freq: Once | RECTAL | Status: AC | PRN

## 2013-08-21 MED ORDER — PANTOPRAZOLE SODIUM 40 MG PO TBEC
40.0000 mg | DELAYED_RELEASE_TABLET | Freq: Every day | ORAL | Status: DC
  Administered 2013-08-22: 40 mg via ORAL
  Filled 2013-08-21: qty 1

## 2013-08-21 MED ORDER — BISACODYL 5 MG PO TBEC: 5.0000 mg | DELAYED_RELEASE_TABLET | Freq: Every day | ORAL | Status: DC | PRN

## 2013-08-21 MED ORDER — ONDANSETRON HCL 4 MG/2ML IJ SOLN
INTRAMUSCULAR | Status: DC | PRN
  Administered 2013-08-21: 4 mg via INTRAVENOUS

## 2013-08-21 MED ORDER — MENTHOL 3 MG MT LOZG: 1.0000 | LOZENGE | OROMUCOSAL | Status: DC | PRN

## 2013-08-21 MED ORDER — DIAZEPAM 5 MG PO TABS: 2.5000 mg | ORAL_TABLET | Freq: Four times a day (QID) | ORAL | Status: DC | PRN

## 2013-08-21 MED ORDER — PHENOL 1.4 % MT LIQD: 1.0000 | OROMUCOSAL | Status: DC | PRN

## 2013-08-21 MED ORDER — PROPOFOL 10 MG/ML IV BOLUS
INTRAVENOUS | Status: DC | PRN
  Administered 2013-08-21: 120 mg via INTRAVENOUS

## 2013-08-21 MED ORDER — GLYCOPYRROLATE 0.2 MG/ML IJ SOLN
INTRAMUSCULAR | Status: DC | PRN
  Administered 2013-08-21: 0.3 mg via INTRAVENOUS
  Administered 2013-08-21 (×2): 0.2 mg via INTRAVENOUS

## 2013-08-21 MED ORDER — PHENYLEPHRINE HCL 10 MG/ML IJ SOLN
INTRAMUSCULAR | Status: DC | PRN
  Administered 2013-08-21: 80 ug via INTRAVENOUS

## 2013-08-21 MED ORDER — LIDOCAINE HCL (CARDIAC) 20 MG/ML IV SOLN
INTRAVENOUS | Status: DC | PRN
  Administered 2013-08-21: 40 mg via INTRAVENOUS

## 2013-08-21 MED ORDER — METOCLOPRAMIDE HCL 5 MG/ML IJ SOLN: 5.0000 mg | Freq: Three times a day (TID) | INTRAMUSCULAR | Status: DC | PRN

## 2013-08-21 MED ORDER — CEFAZOLIN SODIUM-DEXTROSE 2-3 GM-% IV SOLR
2.0000 g | Freq: Four times a day (QID) | INTRAVENOUS | Status: AC
  Administered 2013-08-21 – 2013-08-22 (×3): 2 g via INTRAVENOUS
  Filled 2013-08-21 (×3): qty 50

## 2013-08-21 MED ORDER — HYDROMORPHONE HCL PF 1 MG/ML IJ SOLN
0.2500 mg | INTRAMUSCULAR | Status: DC | PRN
Start: 2013-08-21 — End: 2013-08-21

## 2013-08-21 MED ORDER — SODIUM CHLORIDE 0.9 % IR SOLN
Status: DC | PRN
  Administered 2013-08-21: 2000 mL

## 2013-08-21 MED ORDER — EZETIMIBE 10 MG PO TABS
10.0000 mg | ORAL_TABLET | Freq: Every day | ORAL | Status: DC
  Administered 2013-08-21 – 2013-08-22 (×2): 10 mg via ORAL
  Filled 2013-08-21 (×2): qty 1

## 2013-08-21 MED ORDER — FENTANYL CITRATE 0.05 MG/ML IJ SOLN
INTRAMUSCULAR | Status: DC | PRN
  Administered 2013-08-21 (×2): 50 ug via INTRAVENOUS

## 2013-08-21 MED ORDER — OXYCODONE-ACETAMINOPHEN 5-325 MG PO TABS
1.0000 | ORAL_TABLET | ORAL | Status: DC | PRN
  Administered 2013-08-21 – 2013-08-22 (×3): 2 via ORAL
  Filled 2013-08-21 (×3): qty 2

## 2013-08-21 MED ORDER — LACTATED RINGERS IV SOLN
INTRAVENOUS | Status: DC | PRN
  Administered 2013-08-21 (×2): via INTRAVENOUS

## 2013-08-21 MED ORDER — TRAMADOL HCL 50 MG PO TABS
50.0000 mg | ORAL_TABLET | Freq: Two times a day (BID) | ORAL | Status: DC | PRN
Start: 2013-08-21 — End: 2013-08-22

## 2013-08-21 MED ORDER — FUROSEMIDE 20 MG PO TABS
20.0000 mg | ORAL_TABLET | Freq: Every day | ORAL | Status: DC
  Administered 2013-08-21 – 2013-08-22 (×2): 20 mg via ORAL
  Filled 2013-08-21 (×2): qty 1

## 2013-08-21 MED ORDER — ALUM & MAG HYDROXIDE-SIMETH 200-200-20 MG/5ML PO SUSP: 30.0000 mL | ORAL | Status: DC | PRN

## 2013-08-21 MED ORDER — DIPHENHYDRAMINE HCL 12.5 MG/5ML PO ELIX: 12.5000 mg | ORAL_SOLUTION | ORAL | Status: DC | PRN

## 2013-08-21 MED ORDER — ACETAMINOPHEN 325 MG PO TABS: 650.0000 mg | ORAL_TABLET | Freq: Four times a day (QID) | ORAL | Status: DC | PRN

## 2013-08-21 MED ORDER — OXYCODONE-ACETAMINOPHEN 5-325 MG PO TABS
1.0000 | ORAL_TABLET | ORAL | Status: AC | PRN
Start: 2013-08-21 — End: ?

## 2013-08-21 MED ORDER — LACTATED RINGERS IV SOLN: INTRAVENOUS | Status: DC

## 2013-08-21 MED ORDER — NEOSTIGMINE METHYLSULFATE 1 MG/ML IJ SOLN
INTRAMUSCULAR | Status: DC | PRN
  Administered 2013-08-21: 2 mg via INTRAVENOUS

## 2013-08-21 MED ORDER — OMEPRAZOLE MAGNESIUM 20 MG PO TBEC: 20.0000 mg | DELAYED_RELEASE_TABLET | Freq: Every day | ORAL | Status: DC

## 2013-08-21 MED ORDER — IRBESARTAN 150 MG PO TABS
150.0000 mg | ORAL_TABLET | Freq: Every day | ORAL | Status: DC
  Administered 2013-08-21 – 2013-08-22 (×2): 150 mg via ORAL
  Filled 2013-08-21 (×2): qty 1

## 2013-08-21 MED ORDER — POLYETHYLENE GLYCOL 3350 17 G PO PACK: 17.0000 g | PACK | Freq: Every day | ORAL | Status: DC | PRN

## 2013-08-21 MED ORDER — DOCUSATE SODIUM 100 MG PO CAPS
100.0000 mg | ORAL_CAPSULE | Freq: Two times a day (BID) | ORAL | Status: DC
  Administered 2013-08-21 – 2013-08-22 (×2): 100 mg via ORAL
  Filled 2013-08-21 (×3): qty 1

## 2013-08-21 MED ORDER — ACETAMINOPHEN 650 MG RE SUPP: 650.0000 mg | Freq: Four times a day (QID) | RECTAL | Status: DC | PRN

## 2013-08-21 MED ORDER — VALSARTAN-HYDROCHLOROTHIAZIDE 160-12.5 MG PO TABS: 1.0000 | ORAL_TABLET | Freq: Every day | ORAL | Status: DC

## 2013-08-21 MED ORDER — RIVAROXABAN 20 MG PO TABS
20.0000 mg | ORAL_TABLET | Freq: Every day | ORAL | Status: DC
  Administered 2013-08-21: 20 mg via ORAL
  Filled 2013-08-21 (×2): qty 1

## 2013-08-21 MED ORDER — ONDANSETRON HCL 4 MG/2ML IJ SOLN: 4.0000 mg | Freq: Once | INTRAMUSCULAR | Status: DC | PRN

## 2013-08-21 MED ORDER — ONDANSETRON HCL 4 MG PO TABS: 4.0000 mg | ORAL_TABLET | Freq: Four times a day (QID) | ORAL | Status: DC | PRN

## 2013-08-21 MED ORDER — LACTATED RINGERS IV SOLN
INTRAVENOUS | Status: DC
  Administered 2013-08-21: 75 mL/h via INTRAVENOUS
  Administered 2013-08-22: 06:00:00 via INTRAVENOUS

## 2013-08-21 MED ORDER — ATORVASTATIN CALCIUM 40 MG PO TABS
40.0000 mg | ORAL_TABLET | Freq: Every day | ORAL | Status: DC
  Administered 2013-08-21: 40 mg via ORAL
  Filled 2013-08-21 (×2): qty 1

## 2013-08-21 MED ORDER — ONDANSETRON HCL 4 MG/2ML IJ SOLN: 4.0000 mg | Freq: Four times a day (QID) | INTRAMUSCULAR | Status: DC | PRN

## 2013-08-21 MED ORDER — METOCLOPRAMIDE HCL 5 MG PO TABS
5.0000 mg | ORAL_TABLET | Freq: Three times a day (TID) | ORAL | Status: DC | PRN
  Filled 2013-08-21: qty 2

## 2013-08-21 MED ORDER — HYDROCHLOROTHIAZIDE 12.5 MG PO CAPS
12.5000 mg | ORAL_CAPSULE | Freq: Every day | ORAL | Status: DC
  Administered 2013-08-21 – 2013-08-22 (×2): 12.5 mg via ORAL
  Filled 2013-08-21 (×2): qty 1

## 2013-08-21 MED ORDER — ROCURONIUM BROMIDE 100 MG/10ML IV SOLN
INTRAVENOUS | Status: DC | PRN
  Administered 2013-08-21: 30 mg via INTRAVENOUS

## 2013-08-21 MED ORDER — DIAZEPAM 2 MG PO TABS: 2.0000 mg | ORAL_TABLET | Freq: Four times a day (QID) | ORAL | Status: AC | PRN

## 2013-08-21 SURGICAL SUPPLY — 70 items
BIT DRILL 170X2.5X (BIT) IMPLANT
BIT DRL 170X2.5X (BIT)
BLADE SAW SGTL 83.5X18.5 (BLADE) ×2 IMPLANT
BRUSH FEMORAL CANAL (MISCELLANEOUS) IMPLANT
CAPT SHOULD DELTAXTEND CEM MOD ×1 IMPLANT
CEMENT BONE DEPUY (Cement) ×2 IMPLANT
CEMENT RESTRICTOR DEPUY SZ 3 (Cement) ×1 IMPLANT
CLOTH BEACON ORANGE TIMEOUT ST (SAFETY) ×2 IMPLANT
COVER SURGICAL LIGHT HANDLE (MISCELLANEOUS) ×2 IMPLANT
DRAPE INCISE IOBAN 66X45 STRL (DRAPES) ×2 IMPLANT
DRAPE SURG 17X11 SM STRL (DRAPES) ×2 IMPLANT
DRAPE U-SHAPE 47X51 STRL (DRAPES) ×2 IMPLANT
DRILL 2.5 (BIT)
DRILL BIT 7/64X5 (BIT) ×2 IMPLANT
DRSG AQUACEL AG ADV 3.5X10 (GAUZE/BANDAGES/DRESSINGS) ×2 IMPLANT
DRSG MEPILEX BORDER 4X8 (GAUZE/BANDAGES/DRESSINGS) ×2 IMPLANT
DURAPREP 26ML APPLICATOR (WOUND CARE) ×4 IMPLANT
ELECT BLADE 4.0 EZ CLEAN MEGAD (MISCELLANEOUS) ×2
ELECT CAUTERY BLADE 6.4 (BLADE) ×2 IMPLANT
ELECT REM PT RETURN 9FT ADLT (ELECTROSURGICAL) ×2
ELECTRODE BLDE 4.0 EZ CLN MEGD (MISCELLANEOUS) ×1 IMPLANT
ELECTRODE REM PT RTRN 9FT ADLT (ELECTROSURGICAL) ×1 IMPLANT
FACESHIELD LNG OPTICON STERILE (SAFETY) ×6 IMPLANT
GLOVE BIO SURGEON STRL SZ7.5 (GLOVE) ×2 IMPLANT
GLOVE BIO SURGEON STRL SZ8 (GLOVE) ×2 IMPLANT
GLOVE EUDERMIC 7 POWDERFREE (GLOVE) ×2 IMPLANT
GLOVE SS BIOGEL STRL SZ 7.5 (GLOVE) ×1 IMPLANT
GLOVE SUPERSENSE BIOGEL SZ 7.5 (GLOVE) ×1
GOWN STRL NON-REIN LRG LVL3 (GOWN DISPOSABLE) ×2 IMPLANT
GOWN STRL REIN XL XLG (GOWN DISPOSABLE) ×4 IMPLANT
HANDPIECE INTERPULSE COAX TIP (DISPOSABLE)
KIT BASIN OR (CUSTOM PROCEDURE TRAY) ×2 IMPLANT
KIT ROOM TURNOVER OR (KITS) ×2 IMPLANT
MANIFOLD NEPTUNE II (INSTRUMENTS) ×2 IMPLANT
NDL HYPO 25GX1X1/2 BEV (NEEDLE) IMPLANT
NDL SUT 6 .5 CRC .975X.05 MAYO (NEEDLE) IMPLANT
NEEDLE HYPO 25GX1X1/2 BEV (NEEDLE) IMPLANT
NEEDLE MAYO TAPER (NEEDLE)
NOZZLE PRISM 8.5MM (MISCELLANEOUS) ×1 IMPLANT
NS IRRIG 1000ML POUR BTL (IV SOLUTION) ×2 IMPLANT
PACK SHOULDER (CUSTOM PROCEDURE TRAY) ×2 IMPLANT
PAD ARMBOARD 7.5X6 YLW CONV (MISCELLANEOUS) ×4 IMPLANT
PASSER SUT SWANSON 36MM LOOP (INSTRUMENTS) IMPLANT
PIN GUIDE 1.2 (PIN) IMPLANT
PIN GUIDE GLENOPHERE 1.5MX300M (PIN) IMPLANT
PIN METAGLENE 2.5 (PIN) IMPLANT
PRESSURIZER FEMORAL UNIV (MISCELLANEOUS) IMPLANT
SET HNDPC FAN SPRY TIP SCT (DISPOSABLE) IMPLANT
SLING ARM FOAM STRAP LRG (SOFTGOODS) IMPLANT
SLING ARM FOAM STRAP XLG (SOFTGOODS) ×2 IMPLANT
SPONGE LAP 18X18 X RAY DECT (DISPOSABLE) ×4 IMPLANT
SPONGE LAP 4X18 X RAY DECT (DISPOSABLE) IMPLANT
STRIP CLOSURE SKIN 1/2X4 (GAUZE/BANDAGES/DRESSINGS) ×2 IMPLANT
SUCTION FRAZIER TIP 10 FR DISP (SUCTIONS) ×2 IMPLANT
SUT BONE WAX W31G (SUTURE) IMPLANT
SUT FIBERWIRE #2 38 T-5 BLUE (SUTURE) ×4
SUT MNCRL AB 3-0 PS2 18 (SUTURE) ×4 IMPLANT
SUT VIC AB 1 CT1 27 (SUTURE) ×6
SUT VIC AB 1 CT1 27XBRD ANBCTR (SUTURE) ×3 IMPLANT
SUT VIC AB 2-0 CT1 27 (SUTURE) ×4
SUT VIC AB 2-0 CT1 TAPERPNT 27 (SUTURE) ×2 IMPLANT
SUT VIC AB 2-0 SH 27 (SUTURE)
SUT VIC AB 2-0 SH 27X BRD (SUTURE) IMPLANT
SUTURE FIBERWR #2 38 T-5 BLUE (SUTURE) IMPLANT
SYR 30ML SLIP (SYRINGE) ×2 IMPLANT
SYR CONTROL 10ML LL (SYRINGE) IMPLANT
TOWEL OR 17X24 6PK STRL BLUE (TOWEL DISPOSABLE) ×2 IMPLANT
TOWEL OR 17X26 10 PK STRL BLUE (TOWEL DISPOSABLE) ×2 IMPLANT
TOWER CARTRIDGE SMART MIX (DISPOSABLE) IMPLANT
WATER STERILE IRR 1000ML POUR (IV SOLUTION) ×2 IMPLANT

## 2013-08-21 NOTE — Preoperative (Signed)
Beta Blockers   Reason not to administer Beta Blockers:Not Applicable 

## 2013-08-21 NOTE — Anesthesia Procedure Notes (Addendum)
Anesthesia Regional Block:  Interscalene brachial plexus block  Pre-Anesthetic Checklist: ,, timeout performed, Correct Patient, Correct Site, Correct Laterality, Correct Procedure, Correct Position, site marked, Risks and benefits discussed,  Surgical consent,  Pre-op evaluation,  At surgeon's request and post-op pain management  Laterality: Left  Prep: chloraprep and alcohol swabs       Needles:  Injection technique: Single-shot  Needle Type: Stimulator Needle - 40        Needle insertion depth: 3 cm   Additional Needles:  Procedures: nerve stimulator Interscalene brachial plexus block  Nerve Stimulator or Paresthesia:  Response: 0.5 mA, 0.1 ms, 3 cm  Additional Responses:   Narrative:  Start time: 08/21/2013 7:05 AM End time: 08/21/2013 7:10 AM Injection made incrementally with aspirations every 5 mL.  Performed by: Personally  Anesthesiologist: Sharolyn Douglas MD  Additional Notes: Pt accepts procedure w/ risks. 12cc 0.5% Marcaine w/ epi w/o difficulty or discomfort. GES     Procedure Name: Intubation Date/Time: 08/21/2013 7:40 AM Performed by: Jenne Campus Pre-anesthesia Checklist: Patient identified, Emergency Drugs available, Suction available, Patient being monitored and Timeout performed Patient Re-evaluated:Patient Re-evaluated prior to inductionOxygen Delivery Method: Circle system utilized Preoxygenation: Pre-oxygenation with 100% oxygen Intubation Type: IV induction Ventilation: Mask ventilation without difficulty Laryngoscope Size: Miller and 2 Grade View: Grade I Tube type: Oral Tube size: 7.0 mm Number of attempts: 1 Airway Equipment and Method: Stylet Placement Confirmation: ETT inserted through vocal cords under direct vision,  positive ETCO2,  CO2 detector and breath sounds checked- equal and bilateral Secured at: 21 cm Tube secured with: Tape Dental Injury: Teeth and Oropharynx as per pre-operative assessment

## 2013-08-21 NOTE — Discharge Instructions (Signed)
° °  Kevin M. Supple, M.D., F.A.A.O.S. °Orthopaedic Surgery °Specializing in Arthroscopic and Reconstructive °Surgery of the Shoulder and Knee °[phone] °[address] - [place], [place] [address] - Fax [phone] ° ° °POST-OP TOTAL SHOULDER REPLACEMENT/SHOULDER HEMIARTHROPLASTY INSTRUCTIONS ° °1. Call the office at [phone] to schedule your first post-op appointment 10-14 days from the date of your surgery. ° °2. The bandage over your incision is waterproof. You may begin showering with this dressing on. You may leave this dressing on until first follow up appointment within 2 weeks. If you would like to remove it you may do so after the 5th day. Go slow and tug at the borders gently to break the bond the dressing has with the skin. The steri strips may come off with the dressing. At this point if there is no drainage it is okay to go without a bandage or you may cover it with a light guaze and tape. Leave the steri-strips in place over your incision. You can expect drainage that is bloody or yellow in nature that should gradually decrease from day of surgery. Change your dressing daily until drainage is completely resolved, then you may feel free to go without a bandage. You can also expect significant bruising around your shoulder that will drift down your arm and into your chest wall. This is very normal and should resolve over several days. ° ° 3. Wear your sling/immobilizer at all times except to perform the exercises below or to occasionally let your arm dangle by your side to stretch your elbow. You also need to sleep in your sling immobilizer until instructed otherwise. ° °4. Range of motion to your elbow, wrist, and hand are encouraged 3-5 times daily. Exercise to your hand and fingers helps to reduce swelling you may experience. ° °5. Utilize ice to the shoulder 3-5 times minimum a day and additionally if you are experiencing pain. ° °6. Prescriptions for a pain medication and a muscle  relaxant are provided for you. It is recommended that if you are experiencing pain that you pain medication alone is not controlling, add the muscle relaxant along with the pain medication which can give additional pain relief. The first 1-2 days is generally the most severe of your pain and then should gradually decrease. As your pain lessens it is recommended that you decrease your use of the pain medications to an "as needed basis'" only and to always comply with the recommended dosages of the pain medications. ° °7. Pain medications can produce constipation along with their use. If you experience this, the use of an over the counter stool softener or laxative daily is recommended.  ° °8. For most patients, if insurance allows, home health services to include therapy has been arranged. ° °9. For additional questions or concerns, please do not hesitate to call the office. If after hours there is an answering service to forward your concerns to the physician on call. ° °POST-OP EXERCISES ° °Pendulum Exercises ° °Perform pendulum exercises while standing and bending at the waist. Support your uninvolved arm on a table or chair and allow your operated arm to hang freely. Make sure to do these exercises passively - not using you shoulder muscles. ° °Repeat 20 times. Do 3 sessions per day. ° ° ° ° °

## 2013-08-21 NOTE — Care Management Note (Signed)
CARE MANAGEMENT NOTE 08/21/2013  Patient:  Sarah Reyes, Sarah Reyes   Account Number:  [id number]  Date Initiated:  08/21/2013  Documentation initiated by:  Ricki Miller  Subjective/Objective Assessment:   80 yr old female s/p left reverse shoulder arthroplasty.     Action/Plan:   PT/OT eval   Anticipated DC Date:     Anticipated DC Plan:           Choice offered to / List presented to:             Status of service:  In process, will continue to follow Medicare Important Message given?   (If response is "NO", the following Medicare IM given date fields will be blank) Date Medicare IM given:   Date Additional Medicare IM given:    Discharge Disposition:    Per UR Regulation:    If discussed at Long Length of Stay Meetings, dates discussed:    Comments:

## 2013-08-21 NOTE — H&P (Signed)
Sarah Reyes    Chief Complaint: LEFT SHOULDER AVASCULAR NECROSIS HPI: The patient is a 80 y.o. female with end stage left shoulder rotator cuff tear arthropathy  Past Medical History  Diagnosis Date  . Hypertension   . GERD (gastroesophageal reflux disease)   . Hyperlipidemia   . Myocardial infarction   . Hx of transient ischemic attack (TIA)   . CHF (congestive heart failure)   . Atrial fibrillation   . History of gastric polyp   . Coronary artery disease     Past Surgical History  Procedure Laterality Date  . Joint replacement Right   . Eye surgery Bilateral 2007    cataract  . Coronary angioplasty with stent placement    . Foot surgery  2004    pins placed after fall then removed  . Colonoscopy with esophagogastroduodenoscopy (egd)      History reviewed. No pertinent family history.  Social History:  reports that she has never smoked. She does not have any smokeless tobacco history on file. She reports that she does not drink alcohol or use illicit drugs.  Allergies:  Allergies  Allergen Reactions  . Sulfa Antibiotics     Face swells    Medications Prior to Admission  Medication Sig Dispense Refill  . acetaminophen (TYLENOL) 500 MG tablet Take 500 mg by mouth 3 (three) times daily.      . calcium-vitamin D (OSCAL WITH D) 500-200 MG-UNIT per tablet Take 1 tablet by mouth daily with breakfast.      . Cholecalciferol (VITAMIN D3) 5000 UNITS CAPS Take 1 capsule by mouth daily.      . Coenzyme Q10-Fish Oil-Vit E (CO-Q 10 OMEGA-3 FISH OIL) CAPS Take 2 capsules by mouth daily.      . cycloSPORINE (RESTASIS) 0.05 % ophthalmic emulsion Place 1 drop into both eyes 2 (two) times daily.      . diphenhydrAMINE (BENADRYL) 25 MG tablet Take 50 mg by mouth daily.      Marland Kitchen ezetimibe (ZETIA) 10 MG tablet Take 10 mg by mouth daily.      . furosemide (LASIX) 20 MG tablet Take 20 mg by mouth daily.      . Hypromellose (GENTEAL OP) Apply 1 drop to eye 2 (two) times daily.      .  metoprolol tartrate (LOPRESSOR) 25 MG tablet Take 25 mg by mouth 2 (two) times daily.      . Multiple Vitamins-Minerals (CENTRUM SILVER ADULT 50+) TABS Take 1 tablet by mouth daily.      Marland Kitchen omeprazole (PRILOSEC OTC) 20 MG tablet Take 20 mg by mouth daily.      . Probiotic Product (PRO-BIOTIC BLEND PO) Take 1 capsule by mouth daily.      . Rivaroxaban (XARELTO) 20 MG TABS tablet Take 20 mg by mouth daily with supper.      . simvastatin (ZOCOR) 80 MG tablet Take 80 mg by mouth daily.      . traMADol (ULTRAM) 50 MG tablet Take 50 mg by mouth 2 (two) times daily as needed for moderate pain.      . valsartan-hydrochlorothiazide (DIOVAN-HCT) 160-12.5 MG per tablet Take 1 tablet by mouth daily.      Marland Kitchen amoxicillin (AMOXIL) 500 MG capsule Take 2,000 mg by mouth once. 1 hour prior to dental visits         Physical Exam: left shoulder with painful and restricted motion as noted at recent office visit.  Vitals  Temp:  [98 F (36.7 C)] 98  F (36.7 C) (01/15 0607) Pulse Rate:  [82] 82 (01/15 0607) Resp:  [18] 18 (01/15 0607) BP: (144)/(39) 144/39 mmHg (01/15 0607) SpO2:  [96 %] 96 % (01/15 0607)  Assessment/Plan  Impression: LEFT SHOULDER AVASCULAR NECROSIS  Plan of Action: Procedure(s): LEFT REVERSE SHOULDER ARTHROPLASTY  Bronc Brosseau M 08/21/2013, 7:31 AM

## 2013-08-21 NOTE — Transfer of Care (Signed)
Immediate Anesthesia Transfer of Care Note  Patient: Sarah Reyes  Procedure(s) Performed: Procedure(s): LEFT REVERSE SHOULDER ARTHROPLASTY (Left)  Patient Location: PACU  Anesthesia Type:General  Level of Consciousness: awake, oriented and patient cooperative  Airway & Oxygen Therapy: Patient Spontanous Breathing and Patient connected to nasal cannula oxygen  Post-op Assessment: Report given to PACU RN and Post -op Vital signs reviewed and stable  Post vital signs: Reviewed  Complications: No apparent anesthesia complications

## 2013-08-21 NOTE — Plan of Care (Signed)
Problem: Consults Goal: Rotator Cuff Repair Patient Education See Patient Education Module for education specifics.  Outcome: Completed/Met Date Met:  08/21/13 Left Reverse Total Shoulder Arthroplasty  Problem: Phase I Progression Outcomes Goal: OT evaluation for ADLs discussed Outcome: Progressing OT eval ordered.  To be completed in am. Goal: Therapeutic exercise per MD order Outcome: Progressing Pending OT evaluation

## 2013-08-21 NOTE — Op Note (Signed)
08/21/2013  9:59 AM  PATIENT:   Sarah Reyes  80 y.o. female  PRE-OPERATIVE DIAGNOSIS:  End stage left shoulder AVN  POST-OPERATIVE DIAGNOSIS:  same  PROCEDURE:  Left reverse shoulder arthroplasty #8 stem, +6 poly, 38 eccentric glenosphere  SURGEON:  Delrae Hagey, Metta Clines M.D.  ASSISTANTS: Shuford pac   ANESTHESIA:   GET + ISB  EBL: 250  SPECIMEN:  none  Drains: none   PATIENT DISPOSITION:  PACU - hemodynamically stable.    PLAN OF CARE: Admit to inpatient   Dictation# [id number]

## 2013-08-21 NOTE — Anesthesia Postprocedure Evaluation (Signed)
  Anesthesia Post-op Note  Patient: Sarah Reyes  Procedure(s) Performed: Procedure(s): LEFT REVERSE SHOULDER ARTHROPLASTY (Left)  Patient Location: PACU  Anesthesia Type:GA combined with regional for post-op pain  Level of Consciousness: awake, alert , oriented and patient cooperative  Airway and Oxygen Therapy: Patient Spontanous Breathing  Post-op Pain: mild  Post-op Assessment: Post-op Vital signs reviewed, Patient's Cardiovascular Status Stable, Respiratory Function Stable, Patent Airway, No signs of Nausea or vomiting and Pain level controlled  Post-op Vital Signs: stable  Complications: No apparent anesthesia complications

## 2013-08-21 NOTE — Anesthesia Preprocedure Evaluation (Addendum)
Anesthesia Evaluation  Patient identified by MRN, date of birth, ID band Patient awake    Reviewed: Allergy & Precautions, H&P , NPO status , Patient's Chart, lab work & pertinent test results, reviewed documented beta blocker date and time   History of Anesthesia Complications Negative for: history of anesthetic complications  Airway Mallampati: II TM Distance: >3 FB Neck ROM: Limited    Dental  (+) Teeth Intact and Dental Advisory Given   Pulmonary neg pulmonary ROS,  breath sounds clear to auscultation        Cardiovascular hypertension, Pt. on medications and Pt. on home beta blockers + CAD, + Past MI (2002), + Cardiac Stents and +CHF + dysrhythmias Atrial Fibrillation Rhythm:Regular Rate:Normal     Neuro/Psych TIA   GI/Hepatic Neg liver ROS, hiatal hernia, GERD-  Medicated and Controlled,  Endo/Other  negative endocrine ROS  Renal/GU negative Renal ROS     Musculoskeletal   Abdominal   Peds  Hematology   Anesthesia Other Findings   Reproductive/Obstetrics negative OB ROS                          Anesthesia Physical Anesthesia Plan  ASA: III  Anesthesia Plan: General   Post-op Pain Management:    Induction: Intravenous  Airway Management Planned: Oral ETT  Additional Equipment:   Intra-op Plan:   Post-operative Plan: Extubation in OR  Informed Consent: I have reviewed the patients History and Physical, chart, labs and discussed the procedure including the risks, benefits and alternatives for the proposed anesthesia with the patient or authorized representative who has indicated his/her understanding and acceptance.     Plan Discussed with:   Anesthesia Plan Comments:         Anesthesia Quick Evaluation

## 2013-08-22 ENCOUNTER — Encounter (HOSPITAL_COMMUNITY): Payer: Self-pay | Admitting: Orthopedic Surgery

## 2013-08-22 NOTE — Progress Notes (Signed)
Sarah Reyes  MRN: [id number] DOB/Age: [id number] 78 y.o. Physician: Ander Slade, M.D. 1 Day Post-Op Procedure(s) (LRB): LEFT REVERSE SHOULDER ARTHROPLASTY (Left)  Subjective: Rested well last night, reports minimal pain this am but did receive pain meds this am. Vital Signs Temp:  [97.2 F (36.2 C)-99 F (37.2 C)] 97.9 F (36.6 C) (01/16 0549) Pulse Rate:  [58-95] 78 (01/16 0549) Resp:  [5-22] 18 (01/16 0549) BP: (92-130)/(37-75) 117/43 mmHg (01/16 0549) SpO2:  [94 %-100 %] 98 % (01/16 0549) Weight:  [75.751 kg (167 lb)] 75.751 kg (167 lb) (01/15 1500)  Lab Results No results found for this basename: WBC, HGB, HCT, PLT,  in the last 72 hours BMET No results found for this basename: NA, K, CL, CO2, GLUCOSE, BUN, CREATININE, CALCIUM,  in the last 72 hours No results found for this basename: inr     Exam  Left shoulder dressing dry, swelling and ecchymosis as anticipated, N/V intact distally LUE  Plan OT this am then d/c home, f/u approx 2 weeks, rx's on chart  Sarah Reyes M 08/22/2013, 7:05 AM

## 2013-08-22 NOTE — Op Note (Signed)
Sarah Reyes, Sarah Reyes                  ACCOUNT NO.:  [id number]  MEDICAL RECORD NO.:  [id number]  LOCATION:  5N04C                        FACILITY:  [facility]  PHYSICIAN:  Metta Clines. Rian Koon, M.D.  DATE OF BIRTH:  12/14/1927  DATE OF PROCEDURE:  08/21/2013 DATE OF DISCHARGE:                              OPERATIVE REPORT   PREOPERATIVE DIAGNOSIS:  End-stage left shoulder avascular necrosis.  POSTOPERATIVE DIAGNOSIS:  End-stage left shoulder avascular necrosis.  PROCEDURE:  Left shoulder reverse arthroplasty utilizing cemented size 8/1 DePuy stem with a +6 polyethylene insert and a 38 eccentric glenoid sphere.  SURGEON:  Metta Clines. Eisa Necaise. M.D.  ASSISTANT:  Reather Laurence Shufford, PA-C.  ANESTHESIA:  General endotracheal as well as an interscalene block.  ESTIMATED BLOOD LOSS:  Minimal.  DRAINS:  None.  HISTORY:  Sarah Reyes is an 80 year old female who has had chronic, progressively increasing left shoulder pain related to end-stage avascular necrosis with complete collapse of the humeral head and marked bony deformity.  Examination shows apparently restricted mobility.  Due to her ongoing pain and functional limitation, she is brought to the operating room at this time for planned left shoulder reverse arthroplasty.  Preoperatively, I counseled Sarah Reyes on treatment options as well as risks versus benefits thereof.  Possible surgical complications were reviewed including potential for bleeding, infection, neurovascular injury, persistent pain, loss of motion, anesthetic complication, failure of the implant, and possible need for additional surgery.  She understands and accepts and agrees with our planned procedure.  PROCEDURE IN DETAIL:  After undergoing routine preop evaluation, the patient received prophylactic antibiotics and an interscalene block was established in the holding area by the Anesthesia Department.  Placed supine on the operating table, underwent smooth induction of  general endotracheal anesthesia.  Placed in the beach chair position and appropriately padded and protected.  Left shoulder girdle region was then sterilely prepped and draped in standard fashion.  Time-out was called.  An anterior approach to the left shoulder was made through a 12 cm deltopectoral incision.  Skin flaps were elevated, and electrocautery was used for hemostasis.  Deltopectoral interval was identified and developed from proximal to distal with cephalic vein retracted laterally in the upper centimeter and a half.  Pectoralis major tendon was tenotomized to improve exposure.  Adhesions in the subacromial/subdeltoid bursal space were divided, and electrocautery was used for hemostasis.  Conjoined tendon was identified and retracted medially.  We divided the prolific bursal tissue over the humeral head and this gained access to the joint with a large amount of blood-tinged synovial fluid evacuated.  Then tenotomized the subscapularis away from the lesser tuberosity and placed 2 tag sutures into the free margin using #2 FiberWire.  Previous rupture of the long head biceps tendon was noted.  We then divided the capsule tissues to the anterior, anteroinferior, inferior, and posteroinferior aspects of the humeral head to allow deliver of the head through the wound and indeed there was complete collapse of the humeral head and there was actually a convex surface to the head due to the severe collapse.  There was some small portion of the rotator cuff  remaining intact posteriorly and anteriorly and this  was carefully protected.  At this point, I used a rongeur to trim the prominence of the greater tuberosity and to remove the sclerotic subchondral bone on the collapsed humeral head.  Subsequently, we gained access to the humeral medullary canal.  Once this was achieved, I then performed hand reaming of the canal up to size 10. Used the oscillating saw to resect the humeral head at  the appropriate level.  At this point, we then proceeded with exposure of the glenoid which was exposed combination of pitchfork, snake tongue, and Fukuda retractors.  The glenoid had actually become deformed such that it was somewhat convex throughout.  I gained circumferential exposure removing all peripheral soft tissues with electrocautery and once the exposure had been achieved, I then placed a guide pin into the center of the glenoid, guide pin was directed for proper depth and then we reamed the glenoid to smooth subchondral bony surface with appropriate reamer.  A central drill hole was then placed.  The glenoid was irrigated, and we impacted the standard glenoid base plate into position.  I then placed the inferior and superior screws both locking in nature to appropriate depth with excellent bony fit and fixation and purchase.  The posterior screw was a nonlocking 18 mm and the anterior screw did not achieve bony contact, so it was not utilized.  Once this was completed, I locked the inferior and superior screws and then we placed the 38 eccentric glenosphere onto the base plate.  This was then tightened and impacted and re-tightened multiple times for secured fixation.  At this point, we returned our attention to the humeral shaft where we completed preparation the humeral canal and this took some modification of the standard technique due to the collapse of the humeral head and sclerosis that had developed about the metaphyseal region of the head and ultimately, we downsized using an 8 stem and the reaming broach was placed to the appropriate depth.  We then used the 8/1 reamer to open up metaphyseal portion of the proximal humerus.  Once this was completed, the size 8/1 trial was placed and we were able to achieve appropriate reduction with the +3 poly.  Once we were confirmed overall fit and alignment was to our satisfaction, we placed a distal cement plug. Canal was  irrigated.  It was dried.  Cement was mixed, and then introduced into the canal in retrograde fashion and the size 8 stem was introduced to the appropriate level at 0 degrees retroversion.  Extra cement was then meticulously removed.  I should mention that we did pass some FiberWire sutures through the femoral neck anteriorly for later repair of the subscap.  Once the cement had completely hardened, we then performed repeat reductions and ultimately found that the +6 poly showed the best soft tissue balance and fit.  The final +6 poly was then impacted after the implant was meticulously cleaned and dried.  Final reduction was performed.  Shoulder was taken through range of motion showing excellent motion and good stability.  I then mobilized the subscapularis and was able to repair back to the humeral neck with #2 FiberWire.  The wound was then irrigated, hemostasis was obtained.  The deltopectoral interval was then reapproximated with figure-of-eight #1 Vicryl sutures, 2-0 Vicryl used for the subcu layer, and intracuticular 3-0 Monocryl for the skin followed by Steri-Strips.  Dry dressing was applied.  Left arm was placed into the sling.  The patient was awakened, extubated, and taken to recovery  room in stable condition.     Metta Clines. Zaneta Lightcap, M.D.     KMS/MEDQ  D:  08/21/2013  T:  08/22/2013  Job:  [id number]

## 2013-08-22 NOTE — Evaluation (Signed)
Occupational Therapy Evaluation and Discharge Patient Details Name: Sarah Reyes MRN: [id number] DOB: [birth year] Today's Date: 08/22/2013 Time: 8786-7672 OT Time Calculation (min): 70 min  OT Assessment / Plan / Recommendation History of present illness reverse LTSA   Clinical Impression   This 80 yo female admitted and underwent above presents to acute OT with all education completed and handouts given--continued OT at home and also recommended HHPT due to balance deficits. Acute OT to sign off due to pt D/C'ing home.     OT Assessment  All further OT needs can be met in the next venue of care    Follow Up Recommendations  Home health OT (and HHPT)       Equipment Recommendations  None recommended by OT          Precautions / Restrictions Precautions Precautions: Shoulder Shoulder Interventions: Shoulder sling/immobilizer;At all times;Off for dressing/bathing/exercises Required Braces or Orthoses: Sling Restrictions Weight Bearing Restrictions: Yes LUE Weight Bearing: Non weight bearing   Pertinent Vitals/Pain Aching shoulder    ADL  Eating/Feeding: Set up;Supervision/safety Where Assessed - Eating/Feeding: Chair Grooming: Minimal assistance Where Assessed - Grooming: Unsupported sitting Upper Body Bathing: Min guard Where Assessed - Upper Body Bathing: Unsupported sit to stand Lower Body Bathing: Moderate assistance Where Assessed - Lower Body Bathing: Unsupported sit to stand Upper Body Dressing: Maximal assistance Where Assessed - Upper Body Dressing: Unsupported standing Lower Body Dressing: +1 Total assistance Where Assessed - Lower Body Dressing: Unsupported sit to stand Toilet Transfer: Minimal assistance Toilet Transfer Method: Sit to stand Toilet Transfer Equipment: Comfort height toilet;Grab bars Toileting - Clothing Manipulation and Hygiene: Minimal assistance Where Assessed - Best boy and Hygiene: Standing Equipment Used:   (sling, SPC) Transfers/Ambulation Related to ADLs: Min guard A for all with [facility] ADL Comments: Post op handout given for general shoulder and exercises    OT Diagnosis: Generalized weakness;Acute pain  OT Problem List: Decreased strength;Decreased range of motion;Impaired balance (sitting and/or standing);Decreased knowledge of precautions;Decreased cognition;Pain    Acute Rehab OT Goals Patient Stated Goal: Home today with follow up Story and HHPT  Visit Information  Last OT Received On: 08/22/13 Assistance Needed: +1 History of Present Illness: reverse LTSA       Prior Functioning     Home Living Family/patient expects to be discharged to:: Private residence Living Arrangements: Spouse/significant other;Children Available Help at Discharge: Family;Available 24 hours/day Type of Home: House Home Access: Level entry Home Layout: One level Home Equipment: Cane - single point Prior Function Level of Independence: Independent with assistive device(s) Communication Communication: No difficulties Dominant Hand: Right         Vision/Perception Vision - History Patient Visual Report: No change from baseline   Cognition  Cognition Arousal/Alertness: Awake/alert Behavior During Therapy: WFL for tasks assessed/performed Memory: Decreased short-term memory (for exercises)    Extremity/Trunk Assessment Upper Extremity Assessment Upper Extremity Assessment: LUE deficits/detail LUE Deficits / Details: reverse LTSA; elbow to digits WNL LUE Coordination: decreased gross motor     Mobility Bed Mobility Overal bed mobility: Needs Assistance Bed Mobility: Supine to Sit;Sit to Supine Supine to sit: Min assist Sit to supine: Min assist General bed mobility comments: Has a rail on her bed at home Transfers Overall transfer level: Needs assistance Equipment used: Straight cane Transfers: Sit to/from Stand Sit to Stand: Min guard     Exercise Other Exercises Other  Exercises: Pt able to with S to do AAROM exercises for shoudler interna;/external rotation; shoulder abduction/adduction; and shoulder  flexion/extension (10reps); gave pt a long shoe horn to act as the dowel stick.. Pt also did 10 reps of elbow flexion./extension in standing; also did modified pendulums (sitting) since pt's balance is not  good---only had her do back and forth and side to side Donning/doffing shirt without moving shoulder: Patient able to independently direct caregiver Method for sponge bathing under operated UE: Supervision/safety Donning/doffing sling/immobilizer: Maximal assistance Correct positioning of sling/immobilizer: Independent Pendulum exercises (written home exercise program): Supervision/safety (in sitting) ROM for elbow, wrist and digits of operated UE: Supervision/safety Sling wearing schedule (on at all times/off for ADL's): Independent Dressing change:  (NA) Positioning of UE while sleeping: Independent      End of Session OT - End of Session Equipment Utilized During Treatment:  (sling) Activity Tolerance: Patient tolerated treatment well Patient left: in chair;with call bell/phone within reach Nurse Communication:  (Pt ready to go)       Almon Register [phone] 08/22/2013, 12:23 PM

## 2013-08-22 NOTE — Care Management Note (Signed)
CARE MANAGEMENT NOTE 08/22/2013  Patient:  Sarah Reyes, Sarah Reyes   Account Number:  [id number]  Date Initiated:  08/21/2013  Documentation initiated by:  Ricki Miller  Subjective/Objective Assessment:   80 yr old female s/p left reverse shoulder arthroplasty.     Action/Plan:   PT/OT eval  CM spoke with patient concerning Home Health needs at discharge.Choice offered. CM called referral to [place] with [facility].   Anticipated DC Date:  08/22/2013   Anticipated DC Plan:  [place]  CM consult      [facility] Choice  HOME HEALTH   Choice offered to / List presented to:  C-1 Patient        [place] arranged  [place]   Status of service:  Completed, signed off Medicare Important Message given?   (If response is "NO", the following Medicare IM given date fields will be blank) Date Medicare IM given:   Date Additional Medicare IM given:    Discharge Disposition:  [place]

## 2013-08-25 DIAGNOSIS — I509 Heart failure, unspecified: Secondary | ICD-10-CM | POA: Diagnosis not present

## 2013-08-25 DIAGNOSIS — I4891 Unspecified atrial fibrillation: Secondary | ICD-10-CM | POA: Diagnosis not present

## 2013-08-25 DIAGNOSIS — M19019 Primary osteoarthritis, unspecified shoulder: Secondary | ICD-10-CM | POA: Diagnosis not present

## 2013-08-25 DIAGNOSIS — IMO0001 Reserved for inherently not codable concepts without codable children: Secondary | ICD-10-CM | POA: Diagnosis not present

## 2013-08-25 DIAGNOSIS — I10 Essential (primary) hypertension: Secondary | ICD-10-CM | POA: Diagnosis not present

## 2013-08-25 DIAGNOSIS — Z471 Aftercare following joint replacement surgery: Secondary | ICD-10-CM | POA: Diagnosis not present

## 2013-08-25 DIAGNOSIS — Z96619 Presence of unspecified artificial shoulder joint: Secondary | ICD-10-CM | POA: Diagnosis not present

## 2013-08-26 DIAGNOSIS — I10 Essential (primary) hypertension: Secondary | ICD-10-CM | POA: Diagnosis not present

## 2013-08-26 DIAGNOSIS — Z471 Aftercare following joint replacement surgery: Secondary | ICD-10-CM | POA: Diagnosis not present

## 2013-08-26 DIAGNOSIS — I4891 Unspecified atrial fibrillation: Secondary | ICD-10-CM | POA: Diagnosis not present

## 2013-08-26 DIAGNOSIS — I509 Heart failure, unspecified: Secondary | ICD-10-CM | POA: Diagnosis not present

## 2013-08-26 DIAGNOSIS — IMO0001 Reserved for inherently not codable concepts without codable children: Secondary | ICD-10-CM | POA: Diagnosis not present

## 2013-08-26 DIAGNOSIS — Z96619 Presence of unspecified artificial shoulder joint: Secondary | ICD-10-CM | POA: Diagnosis not present

## 2013-08-27 DIAGNOSIS — Z471 Aftercare following joint replacement surgery: Secondary | ICD-10-CM | POA: Diagnosis not present

## 2013-08-27 DIAGNOSIS — I4891 Unspecified atrial fibrillation: Secondary | ICD-10-CM | POA: Diagnosis not present

## 2013-08-27 DIAGNOSIS — Z96619 Presence of unspecified artificial shoulder joint: Secondary | ICD-10-CM | POA: Diagnosis not present

## 2013-08-27 DIAGNOSIS — IMO0001 Reserved for inherently not codable concepts without codable children: Secondary | ICD-10-CM | POA: Diagnosis not present

## 2013-08-27 DIAGNOSIS — I509 Heart failure, unspecified: Secondary | ICD-10-CM | POA: Diagnosis not present

## 2013-08-27 DIAGNOSIS — I10 Essential (primary) hypertension: Secondary | ICD-10-CM | POA: Diagnosis not present

## 2013-08-27 NOTE — Discharge Summary (Signed)
PATIENT ID:      Sarah Reyes  MRN:     [id number] DOB/AGE:    1927-12-15 / 80 y.o.     DISCHARGE SUMMARY  ADMISSION DATE:    08/21/2013 DISCHARGE DATE:  08/22/2013  ADMISSION DIAGNOSIS: * No pre-op diagnosis entered * Past Medical History  Diagnosis Date  . Hypertension   . GERD (gastroesophageal reflux disease)   . Hyperlipidemia   . Myocardial infarction   . Hx of transient ischemic attack (TIA)   . CHF (congestive heart failure)   . Atrial fibrillation   . History of gastric polyp   . Coronary artery disease     DISCHARGE DIAGNOSIS:   Active Problems:   S/P shoulder replacement   PROCEDURE: Procedure(s): LEFT REVERSE SHOULDER ARTHROPLASTY on 08/21/2013  CONSULTS:   none  HISTORY:  See H&P in chart.  HOSPITAL COURSE:  Sarah Reyes is a 80 y.o. admitted on 08/21/2013 with a chief complaint of left shoulder pain and dysfunction, and found to have a diagnosis of a left rotator cuff tear arthropathy.  They were brought to the operating room on 08/21/2013 and underwent Procedure(s): LEFT REVERSE SHOULDER ARTHROPLASTY.    They were given perioperative antibiotics:  Anti-infectives   Start     Dose/Rate Route Frequency Ordered Stop   08/21/13 1500  ceFAZolin (ANCEF) IVPB 2 g/50 mL premix     2 g 100 mL/hr over 30 Minutes Intravenous Every 6 hours 08/21/13 1355 08/22/13 0338   08/21/13 0600  ceFAZolin (ANCEF) IVPB 2 g/50 mL premix     2 g 100 mL/hr over 30 Minutes Intravenous On call to O.R. 08/20/13 1359 08/21/13 0735    .  Patient underwent the above named procedure and tolerated it well. The following day they were hemodynamically stable and pain was controlled on oral analgesics. They were neurovascularly intact to the operative extremity. OT was ordered and worked with patient per protocol. They were medically and orthopaedically stable for discharge on 08/22/2013.    DIAGNOSTIC STUDIES:  RECENT RADIOGRAPHIC STUDIES :  Dg Chest 2 View  08/15/2013   CLINICAL DATA:   Preop evaluation  EXAM: CHEST  2 VIEW  COMPARISON:  None.  FINDINGS: The cardiac silhouette in the upper limits normal. Partially calcified mitral annulus is identified. No focal reason consolidation or focal infiltrates. The osseous structures are unremarkable. Low lung volumes. Chronic compression deformity is identified within the mid thoracic spine.  IMPRESSION: No evidence of acute cardiopulmonary disease.   Electronically Signed   By: Margaree Mackintosh M.D.   On: 08/15/2013 12:21    RECENT VITAL SIGNS:  No data found. Marland Kitchen  RECENT EKG RESULTS:   No orders found for this or any previous visit.  DISCHARGE INSTRUCTIONS:    DISCHARGE MEDICATIONS:     Medication List         acetaminophen 500 MG tablet  Commonly known as:  TYLENOL  Take 500 mg by mouth 3 (three) times daily.     amoxicillin 500 MG capsule  Commonly known as:  AMOXIL  Take 2,000 mg by mouth once. 1 hour prior to dental visits     calcium-vitamin D 500-200 MG-UNIT per tablet  Commonly known as:  OSCAL WITH D  Take 1 tablet by mouth daily with breakfast.     CENTRUM SILVER ADULT 50+ Tabs  Take 1 tablet by mouth daily.     CO-Q 10 Omega-3 Fish Oil Caps  Take 2 capsules by mouth daily.  cycloSPORINE 0.05 % ophthalmic emulsion  Commonly known as:  RESTASIS  Place 1 drop into both eyes 2 (two) times daily.     diazepam 2 MG tablet  Commonly known as:  VALIUM  Take 1 tablet (2 mg total) by mouth every 6 (six) hours as needed for muscle spasms or sedation.     diphenhydrAMINE 25 MG tablet  Commonly known as:  BENADRYL  Take 50 mg by mouth daily.     ezetimibe 10 MG tablet  Commonly known as:  ZETIA  Take 10 mg by mouth daily.     furosemide 20 MG tablet  Commonly known as:  LASIX  Take 20 mg by mouth daily.     GENTEAL OP  Apply 1 drop to eye 2 (two) times daily.     metoprolol tartrate 25 MG tablet  Commonly known as:  LOPRESSOR  Take 25 mg by mouth 2 (two) times daily.     omeprazole 20 MG tablet   Commonly known as:  PRILOSEC OTC  Take 20 mg by mouth daily.     oxyCODONE-acetaminophen 5-325 MG per tablet  Commonly known as:  PERCOCET  Take 1-2 tablets by mouth every 4 (four) hours as needed for severe pain.     PRO-BIOTIC BLEND PO  Take 1 capsule by mouth daily.     simvastatin 80 MG tablet  Commonly known as:  ZOCOR  Take 80 mg by mouth daily.     traMADol 50 MG tablet  Commonly known as:  ULTRAM  Take 50 mg by mouth 2 (two) times daily as needed for moderate pain.     valsartan-hydrochlorothiazide 160-12.5 MG per tablet  Commonly known as:  DIOVAN-HCT  Take 1 tablet by mouth daily.     Vitamin D3 5000 UNITS Caps  Take 1 capsule by mouth daily.     XARELTO 20 MG Tabs tablet  Generic drug:  Rivaroxaban  Take 20 mg by mouth daily with supper.        FOLLOW UP VISIT:       Follow-up Information   Follow up with Marin Shutter, MD. (call to be seen in 10-14 days)    Specialty:  Orthopedic Surgery   Contact information:   [address] [place] [address] [place] [address] [phone]       DISCHARGE TO: Home  DISPOSITION: Good  DISCHARGE CONDITION:  Festus Barren for Dr. Justice Britain 08/27/2013, 9:45 AM

## 2013-08-28 DIAGNOSIS — I10 Essential (primary) hypertension: Secondary | ICD-10-CM | POA: Diagnosis not present

## 2013-08-28 DIAGNOSIS — I4891 Unspecified atrial fibrillation: Secondary | ICD-10-CM | POA: Diagnosis not present

## 2013-08-28 DIAGNOSIS — Z96619 Presence of unspecified artificial shoulder joint: Secondary | ICD-10-CM | POA: Diagnosis not present

## 2013-08-28 DIAGNOSIS — Z471 Aftercare following joint replacement surgery: Secondary | ICD-10-CM | POA: Diagnosis not present

## 2013-08-28 DIAGNOSIS — IMO0001 Reserved for inherently not codable concepts without codable children: Secondary | ICD-10-CM | POA: Diagnosis not present

## 2013-08-28 DIAGNOSIS — I509 Heart failure, unspecified: Secondary | ICD-10-CM | POA: Diagnosis not present

## 2013-08-29 DIAGNOSIS — I4891 Unspecified atrial fibrillation: Secondary | ICD-10-CM | POA: Diagnosis not present

## 2013-08-29 DIAGNOSIS — I10 Essential (primary) hypertension: Secondary | ICD-10-CM | POA: Diagnosis not present

## 2013-08-29 DIAGNOSIS — Z471 Aftercare following joint replacement surgery: Secondary | ICD-10-CM | POA: Diagnosis not present

## 2013-08-29 DIAGNOSIS — I509 Heart failure, unspecified: Secondary | ICD-10-CM | POA: Diagnosis not present

## 2013-08-29 DIAGNOSIS — IMO0001 Reserved for inherently not codable concepts without codable children: Secondary | ICD-10-CM | POA: Diagnosis not present

## 2013-08-29 DIAGNOSIS — Z96619 Presence of unspecified artificial shoulder joint: Secondary | ICD-10-CM | POA: Diagnosis not present

## 2013-09-01 DIAGNOSIS — IMO0001 Reserved for inherently not codable concepts without codable children: Secondary | ICD-10-CM | POA: Diagnosis not present

## 2013-09-01 DIAGNOSIS — Z96619 Presence of unspecified artificial shoulder joint: Secondary | ICD-10-CM | POA: Diagnosis not present

## 2013-09-01 DIAGNOSIS — Z471 Aftercare following joint replacement surgery: Secondary | ICD-10-CM | POA: Diagnosis not present

## 2013-09-01 DIAGNOSIS — I509 Heart failure, unspecified: Secondary | ICD-10-CM | POA: Diagnosis not present

## 2013-09-01 DIAGNOSIS — I4891 Unspecified atrial fibrillation: Secondary | ICD-10-CM | POA: Diagnosis not present

## 2013-09-01 DIAGNOSIS — I10 Essential (primary) hypertension: Secondary | ICD-10-CM | POA: Diagnosis not present

## 2013-09-02 DIAGNOSIS — I10 Essential (primary) hypertension: Secondary | ICD-10-CM | POA: Diagnosis not present

## 2013-09-02 DIAGNOSIS — I4891 Unspecified atrial fibrillation: Secondary | ICD-10-CM | POA: Diagnosis not present

## 2013-09-02 DIAGNOSIS — IMO0001 Reserved for inherently not codable concepts without codable children: Secondary | ICD-10-CM | POA: Diagnosis not present

## 2013-09-02 DIAGNOSIS — I509 Heart failure, unspecified: Secondary | ICD-10-CM | POA: Diagnosis not present

## 2013-09-02 DIAGNOSIS — Z96619 Presence of unspecified artificial shoulder joint: Secondary | ICD-10-CM | POA: Diagnosis not present

## 2013-09-02 DIAGNOSIS — Z471 Aftercare following joint replacement surgery: Secondary | ICD-10-CM | POA: Diagnosis not present

## 2013-09-03 DIAGNOSIS — M19019 Primary osteoarthritis, unspecified shoulder: Secondary | ICD-10-CM | POA: Diagnosis not present

## 2013-09-03 DIAGNOSIS — I509 Heart failure, unspecified: Secondary | ICD-10-CM | POA: Diagnosis not present

## 2013-09-03 DIAGNOSIS — I10 Essential (primary) hypertension: Secondary | ICD-10-CM | POA: Diagnosis not present

## 2013-09-03 DIAGNOSIS — IMO0001 Reserved for inherently not codable concepts without codable children: Secondary | ICD-10-CM | POA: Diagnosis not present

## 2013-09-03 DIAGNOSIS — Z96619 Presence of unspecified artificial shoulder joint: Secondary | ICD-10-CM | POA: Diagnosis not present

## 2013-09-03 DIAGNOSIS — Z471 Aftercare following joint replacement surgery: Secondary | ICD-10-CM | POA: Diagnosis not present

## 2013-09-03 DIAGNOSIS — I4891 Unspecified atrial fibrillation: Secondary | ICD-10-CM | POA: Diagnosis not present

## 2013-09-04 DIAGNOSIS — I4891 Unspecified atrial fibrillation: Secondary | ICD-10-CM | POA: Diagnosis not present

## 2013-09-04 DIAGNOSIS — IMO0001 Reserved for inherently not codable concepts without codable children: Secondary | ICD-10-CM | POA: Diagnosis not present

## 2013-09-04 DIAGNOSIS — I509 Heart failure, unspecified: Secondary | ICD-10-CM | POA: Diagnosis not present

## 2013-09-04 DIAGNOSIS — Z471 Aftercare following joint replacement surgery: Secondary | ICD-10-CM | POA: Diagnosis not present

## 2013-09-04 DIAGNOSIS — I10 Essential (primary) hypertension: Secondary | ICD-10-CM | POA: Diagnosis not present

## 2013-09-04 DIAGNOSIS — Z96619 Presence of unspecified artificial shoulder joint: Secondary | ICD-10-CM | POA: Diagnosis not present

## 2013-09-05 DIAGNOSIS — I509 Heart failure, unspecified: Secondary | ICD-10-CM | POA: Diagnosis not present

## 2013-09-05 DIAGNOSIS — I10 Essential (primary) hypertension: Secondary | ICD-10-CM | POA: Diagnosis not present

## 2013-09-05 DIAGNOSIS — Z96619 Presence of unspecified artificial shoulder joint: Secondary | ICD-10-CM | POA: Diagnosis not present

## 2013-09-05 DIAGNOSIS — Z471 Aftercare following joint replacement surgery: Secondary | ICD-10-CM | POA: Diagnosis not present

## 2013-09-05 DIAGNOSIS — I4891 Unspecified atrial fibrillation: Secondary | ICD-10-CM | POA: Diagnosis not present

## 2013-09-05 DIAGNOSIS — IMO0001 Reserved for inherently not codable concepts without codable children: Secondary | ICD-10-CM | POA: Diagnosis not present

## 2013-09-08 DIAGNOSIS — I509 Heart failure, unspecified: Secondary | ICD-10-CM | POA: Diagnosis not present

## 2013-09-08 DIAGNOSIS — Z96619 Presence of unspecified artificial shoulder joint: Secondary | ICD-10-CM | POA: Diagnosis not present

## 2013-09-08 DIAGNOSIS — Z471 Aftercare following joint replacement surgery: Secondary | ICD-10-CM | POA: Diagnosis not present

## 2013-09-08 DIAGNOSIS — I4891 Unspecified atrial fibrillation: Secondary | ICD-10-CM | POA: Diagnosis not present

## 2013-09-08 DIAGNOSIS — I10 Essential (primary) hypertension: Secondary | ICD-10-CM | POA: Diagnosis not present

## 2013-09-08 DIAGNOSIS — IMO0001 Reserved for inherently not codable concepts without codable children: Secondary | ICD-10-CM | POA: Diagnosis not present

## 2013-09-10 DIAGNOSIS — I4891 Unspecified atrial fibrillation: Secondary | ICD-10-CM | POA: Diagnosis not present

## 2013-09-10 DIAGNOSIS — I10 Essential (primary) hypertension: Secondary | ICD-10-CM | POA: Diagnosis not present

## 2013-09-10 DIAGNOSIS — IMO0001 Reserved for inherently not codable concepts without codable children: Secondary | ICD-10-CM | POA: Diagnosis not present

## 2013-09-10 DIAGNOSIS — I509 Heart failure, unspecified: Secondary | ICD-10-CM | POA: Diagnosis not present

## 2013-09-10 DIAGNOSIS — Z471 Aftercare following joint replacement surgery: Secondary | ICD-10-CM | POA: Diagnosis not present

## 2013-09-10 DIAGNOSIS — Z96619 Presence of unspecified artificial shoulder joint: Secondary | ICD-10-CM | POA: Diagnosis not present

## 2013-09-12 DIAGNOSIS — I509 Heart failure, unspecified: Secondary | ICD-10-CM | POA: Diagnosis not present

## 2013-09-12 DIAGNOSIS — Z96619 Presence of unspecified artificial shoulder joint: Secondary | ICD-10-CM | POA: Diagnosis not present

## 2013-09-12 DIAGNOSIS — I4891 Unspecified atrial fibrillation: Secondary | ICD-10-CM | POA: Diagnosis not present

## 2013-09-12 DIAGNOSIS — I10 Essential (primary) hypertension: Secondary | ICD-10-CM | POA: Diagnosis not present

## 2013-09-12 DIAGNOSIS — Z471 Aftercare following joint replacement surgery: Secondary | ICD-10-CM | POA: Diagnosis not present

## 2013-09-12 DIAGNOSIS — IMO0001 Reserved for inherently not codable concepts without codable children: Secondary | ICD-10-CM | POA: Diagnosis not present

## 2013-09-15 DIAGNOSIS — Z96619 Presence of unspecified artificial shoulder joint: Secondary | ICD-10-CM | POA: Diagnosis not present

## 2013-09-15 DIAGNOSIS — I10 Essential (primary) hypertension: Secondary | ICD-10-CM | POA: Diagnosis not present

## 2013-09-15 DIAGNOSIS — I4891 Unspecified atrial fibrillation: Secondary | ICD-10-CM | POA: Diagnosis not present

## 2013-09-15 DIAGNOSIS — IMO0001 Reserved for inherently not codable concepts without codable children: Secondary | ICD-10-CM | POA: Diagnosis not present

## 2013-09-15 DIAGNOSIS — Z471 Aftercare following joint replacement surgery: Secondary | ICD-10-CM | POA: Diagnosis not present

## 2013-09-15 DIAGNOSIS — I509 Heart failure, unspecified: Secondary | ICD-10-CM | POA: Diagnosis not present

## 2013-09-17 DIAGNOSIS — Z96619 Presence of unspecified artificial shoulder joint: Secondary | ICD-10-CM | POA: Diagnosis not present

## 2013-09-17 DIAGNOSIS — IMO0001 Reserved for inherently not codable concepts without codable children: Secondary | ICD-10-CM | POA: Diagnosis not present

## 2013-09-17 DIAGNOSIS — I4891 Unspecified atrial fibrillation: Secondary | ICD-10-CM | POA: Diagnosis not present

## 2013-09-17 DIAGNOSIS — Z471 Aftercare following joint replacement surgery: Secondary | ICD-10-CM | POA: Diagnosis not present

## 2013-09-17 DIAGNOSIS — I10 Essential (primary) hypertension: Secondary | ICD-10-CM | POA: Diagnosis not present

## 2013-09-17 DIAGNOSIS — I509 Heart failure, unspecified: Secondary | ICD-10-CM | POA: Diagnosis not present

## 2013-09-19 DIAGNOSIS — IMO0001 Reserved for inherently not codable concepts without codable children: Secondary | ICD-10-CM | POA: Diagnosis not present

## 2013-09-19 DIAGNOSIS — I509 Heart failure, unspecified: Secondary | ICD-10-CM | POA: Diagnosis not present

## 2013-09-19 DIAGNOSIS — Z96619 Presence of unspecified artificial shoulder joint: Secondary | ICD-10-CM | POA: Diagnosis not present

## 2013-09-19 DIAGNOSIS — I10 Essential (primary) hypertension: Secondary | ICD-10-CM | POA: Diagnosis not present

## 2013-09-19 DIAGNOSIS — Z471 Aftercare following joint replacement surgery: Secondary | ICD-10-CM | POA: Diagnosis not present

## 2013-09-19 DIAGNOSIS — I4891 Unspecified atrial fibrillation: Secondary | ICD-10-CM | POA: Diagnosis not present

## 2013-09-22 DIAGNOSIS — I4891 Unspecified atrial fibrillation: Secondary | ICD-10-CM | POA: Diagnosis not present

## 2013-09-22 DIAGNOSIS — Z96619 Presence of unspecified artificial shoulder joint: Secondary | ICD-10-CM | POA: Diagnosis not present

## 2013-09-22 DIAGNOSIS — I509 Heart failure, unspecified: Secondary | ICD-10-CM | POA: Diagnosis not present

## 2013-09-22 DIAGNOSIS — I10 Essential (primary) hypertension: Secondary | ICD-10-CM | POA: Diagnosis not present

## 2013-09-22 DIAGNOSIS — Z471 Aftercare following joint replacement surgery: Secondary | ICD-10-CM | POA: Diagnosis not present

## 2013-09-22 DIAGNOSIS — IMO0001 Reserved for inherently not codable concepts without codable children: Secondary | ICD-10-CM | POA: Diagnosis not present

## 2013-09-24 DIAGNOSIS — I4891 Unspecified atrial fibrillation: Secondary | ICD-10-CM | POA: Diagnosis not present

## 2013-09-24 DIAGNOSIS — IMO0001 Reserved for inherently not codable concepts without codable children: Secondary | ICD-10-CM | POA: Diagnosis not present

## 2013-09-24 DIAGNOSIS — I10 Essential (primary) hypertension: Secondary | ICD-10-CM | POA: Diagnosis not present

## 2013-09-24 DIAGNOSIS — I509 Heart failure, unspecified: Secondary | ICD-10-CM | POA: Diagnosis not present

## 2013-09-24 DIAGNOSIS — Z96619 Presence of unspecified artificial shoulder joint: Secondary | ICD-10-CM | POA: Diagnosis not present

## 2013-09-24 DIAGNOSIS — Z471 Aftercare following joint replacement surgery: Secondary | ICD-10-CM | POA: Diagnosis not present

## 2013-09-26 DIAGNOSIS — I10 Essential (primary) hypertension: Secondary | ICD-10-CM | POA: Diagnosis not present

## 2013-09-26 DIAGNOSIS — I509 Heart failure, unspecified: Secondary | ICD-10-CM | POA: Diagnosis not present

## 2013-09-26 DIAGNOSIS — Z471 Aftercare following joint replacement surgery: Secondary | ICD-10-CM | POA: Diagnosis not present

## 2013-09-26 DIAGNOSIS — Z96619 Presence of unspecified artificial shoulder joint: Secondary | ICD-10-CM | POA: Diagnosis not present

## 2013-09-26 DIAGNOSIS — I4891 Unspecified atrial fibrillation: Secondary | ICD-10-CM | POA: Diagnosis not present

## 2013-09-26 DIAGNOSIS — IMO0001 Reserved for inherently not codable concepts without codable children: Secondary | ICD-10-CM | POA: Diagnosis not present

## 2013-09-29 DIAGNOSIS — I509 Heart failure, unspecified: Secondary | ICD-10-CM | POA: Diagnosis not present

## 2013-09-29 DIAGNOSIS — I4891 Unspecified atrial fibrillation: Secondary | ICD-10-CM | POA: Diagnosis not present

## 2013-09-29 DIAGNOSIS — Z471 Aftercare following joint replacement surgery: Secondary | ICD-10-CM | POA: Diagnosis not present

## 2013-09-29 DIAGNOSIS — IMO0001 Reserved for inherently not codable concepts without codable children: Secondary | ICD-10-CM | POA: Diagnosis not present

## 2013-09-29 DIAGNOSIS — Z96619 Presence of unspecified artificial shoulder joint: Secondary | ICD-10-CM | POA: Diagnosis not present

## 2013-09-29 DIAGNOSIS — I10 Essential (primary) hypertension: Secondary | ICD-10-CM | POA: Diagnosis not present

## 2013-10-01 DIAGNOSIS — IMO0001 Reserved for inherently not codable concepts without codable children: Secondary | ICD-10-CM | POA: Diagnosis not present

## 2013-10-01 DIAGNOSIS — I10 Essential (primary) hypertension: Secondary | ICD-10-CM | POA: Diagnosis not present

## 2013-10-01 DIAGNOSIS — Z96619 Presence of unspecified artificial shoulder joint: Secondary | ICD-10-CM | POA: Diagnosis not present

## 2013-10-01 DIAGNOSIS — I509 Heart failure, unspecified: Secondary | ICD-10-CM | POA: Diagnosis not present

## 2013-10-01 DIAGNOSIS — Z471 Aftercare following joint replacement surgery: Secondary | ICD-10-CM | POA: Diagnosis not present

## 2013-10-01 DIAGNOSIS — I4891 Unspecified atrial fibrillation: Secondary | ICD-10-CM | POA: Diagnosis not present

## 2013-10-03 DIAGNOSIS — I4891 Unspecified atrial fibrillation: Secondary | ICD-10-CM | POA: Diagnosis not present

## 2013-10-03 DIAGNOSIS — I10 Essential (primary) hypertension: Secondary | ICD-10-CM | POA: Diagnosis not present

## 2013-10-03 DIAGNOSIS — Z96619 Presence of unspecified artificial shoulder joint: Secondary | ICD-10-CM | POA: Diagnosis not present

## 2013-10-03 DIAGNOSIS — IMO0001 Reserved for inherently not codable concepts without codable children: Secondary | ICD-10-CM | POA: Diagnosis not present

## 2013-10-03 DIAGNOSIS — I509 Heart failure, unspecified: Secondary | ICD-10-CM | POA: Diagnosis not present

## 2013-10-03 DIAGNOSIS — Z471 Aftercare following joint replacement surgery: Secondary | ICD-10-CM | POA: Diagnosis not present

## 2013-10-29 DIAGNOSIS — Z96619 Presence of unspecified artificial shoulder joint: Secondary | ICD-10-CM | POA: Diagnosis not present

## 2013-10-29 DIAGNOSIS — M19019 Primary osteoarthritis, unspecified shoulder: Secondary | ICD-10-CM | POA: Diagnosis not present

## 2013-11-11 DIAGNOSIS — M25519 Pain in unspecified shoulder: Secondary | ICD-10-CM | POA: Diagnosis not present

## 2013-11-11 DIAGNOSIS — E7800 Pure hypercholesterolemia, unspecified: Secondary | ICD-10-CM | POA: Diagnosis not present

## 2013-11-11 DIAGNOSIS — I10 Essential (primary) hypertension: Secondary | ICD-10-CM | POA: Diagnosis not present

## 2013-11-26 DIAGNOSIS — I4891 Unspecified atrial fibrillation: Secondary | ICD-10-CM | POA: Diagnosis not present

## 2013-11-26 DIAGNOSIS — I509 Heart failure, unspecified: Secondary | ICD-10-CM | POA: Diagnosis not present

## 2013-11-26 DIAGNOSIS — I2510 Atherosclerotic heart disease of native coronary artery without angina pectoris: Secondary | ICD-10-CM | POA: Diagnosis not present

## 2013-11-26 DIAGNOSIS — Z7901 Long term (current) use of anticoagulants: Secondary | ICD-10-CM | POA: Diagnosis not present

## 2013-11-26 DIAGNOSIS — I5030 Unspecified diastolic (congestive) heart failure: Secondary | ICD-10-CM | POA: Diagnosis not present

## 2013-11-26 DIAGNOSIS — I110 Hypertensive heart disease with heart failure: Secondary | ICD-10-CM | POA: Diagnosis not present

## 2013-11-26 DIAGNOSIS — E785 Hyperlipidemia, unspecified: Secondary | ICD-10-CM | POA: Diagnosis not present

## 2013-12-24 DIAGNOSIS — H18469 Peripheral corneal degeneration, unspecified eye: Secondary | ICD-10-CM | POA: Diagnosis not present

## 2013-12-24 DIAGNOSIS — H43819 Vitreous degeneration, unspecified eye: Secondary | ICD-10-CM | POA: Diagnosis not present

## 2013-12-24 DIAGNOSIS — H04129 Dry eye syndrome of unspecified lacrimal gland: Secondary | ICD-10-CM | POA: Diagnosis not present

## 2013-12-24 DIAGNOSIS — H18459 Nodular corneal degeneration, unspecified eye: Secondary | ICD-10-CM | POA: Diagnosis not present

## 2014-01-06 DIAGNOSIS — H26499 Other secondary cataract, unspecified eye: Secondary | ICD-10-CM | POA: Diagnosis not present

## 2014-01-13 DIAGNOSIS — K219 Gastro-esophageal reflux disease without esophagitis: Secondary | ICD-10-CM | POA: Diagnosis not present

## 2014-01-13 DIAGNOSIS — H18469 Peripheral corneal degeneration, unspecified eye: Secondary | ICD-10-CM | POA: Diagnosis not present

## 2014-01-13 DIAGNOSIS — H18459 Nodular corneal degeneration, unspecified eye: Secondary | ICD-10-CM | POA: Diagnosis not present

## 2014-01-13 DIAGNOSIS — Z79899 Other long term (current) drug therapy: Secondary | ICD-10-CM | POA: Diagnosis not present

## 2014-01-13 DIAGNOSIS — H04129 Dry eye syndrome of unspecified lacrimal gland: Secondary | ICD-10-CM | POA: Diagnosis not present

## 2014-01-13 DIAGNOSIS — H26499 Other secondary cataract, unspecified eye: Secondary | ICD-10-CM | POA: Diagnosis not present

## 2014-01-13 DIAGNOSIS — H179 Unspecified corneal scar and opacity: Secondary | ICD-10-CM | POA: Diagnosis not present

## 2014-01-28 DIAGNOSIS — Z96619 Presence of unspecified artificial shoulder joint: Secondary | ICD-10-CM | POA: Diagnosis not present

## 2014-02-10 DIAGNOSIS — K219 Gastro-esophageal reflux disease without esophagitis: Secondary | ICD-10-CM | POA: Diagnosis not present

## 2014-02-10 DIAGNOSIS — S51809A Unspecified open wound of unspecified forearm, initial encounter: Secondary | ICD-10-CM | POA: Diagnosis not present

## 2014-02-10 DIAGNOSIS — E7800 Pure hypercholesterolemia, unspecified: Secondary | ICD-10-CM | POA: Diagnosis not present

## 2014-02-10 DIAGNOSIS — S31809A Unspecified open wound of unspecified buttock, initial encounter: Secondary | ICD-10-CM | POA: Diagnosis not present

## 2014-02-10 DIAGNOSIS — Z79899 Other long term (current) drug therapy: Secondary | ICD-10-CM | POA: Diagnosis not present

## 2014-02-10 DIAGNOSIS — I4891 Unspecified atrial fibrillation: Secondary | ICD-10-CM | POA: Diagnosis not present

## 2014-02-10 DIAGNOSIS — I10 Essential (primary) hypertension: Secondary | ICD-10-CM | POA: Diagnosis not present

## 2014-02-10 DIAGNOSIS — R296 Repeated falls: Secondary | ICD-10-CM | POA: Diagnosis not present

## 2014-02-12 DIAGNOSIS — S41109A Unspecified open wound of unspecified upper arm, initial encounter: Secondary | ICD-10-CM | POA: Diagnosis not present

## 2014-04-09 DIAGNOSIS — I2510 Atherosclerotic heart disease of native coronary artery without angina pectoris: Secondary | ICD-10-CM | POA: Diagnosis not present

## 2014-04-09 DIAGNOSIS — I5032 Chronic diastolic (congestive) heart failure: Secondary | ICD-10-CM | POA: Diagnosis not present

## 2014-04-09 DIAGNOSIS — I509 Heart failure, unspecified: Secondary | ICD-10-CM | POA: Diagnosis not present

## 2014-04-09 DIAGNOSIS — E785 Hyperlipidemia, unspecified: Secondary | ICD-10-CM | POA: Diagnosis not present

## 2014-04-09 DIAGNOSIS — I4891 Unspecified atrial fibrillation: Secondary | ICD-10-CM | POA: Diagnosis not present

## 2014-04-09 DIAGNOSIS — Z7901 Long term (current) use of anticoagulants: Secondary | ICD-10-CM | POA: Diagnosis not present

## 2014-04-09 DIAGNOSIS — I110 Hypertensive heart disease with heart failure: Secondary | ICD-10-CM | POA: Diagnosis not present

## 2014-04-09 DIAGNOSIS — N189 Chronic kidney disease, unspecified: Secondary | ICD-10-CM | POA: Diagnosis not present

## 2014-04-17 IMAGING — CR DG CHEST 2V
2 series · 2 of 2 positions shown · non-contrast
Comparison: None.

CLINICAL DATA: Preop evaluation

EXAM:
CHEST  2 VIEW

[w chest pa]
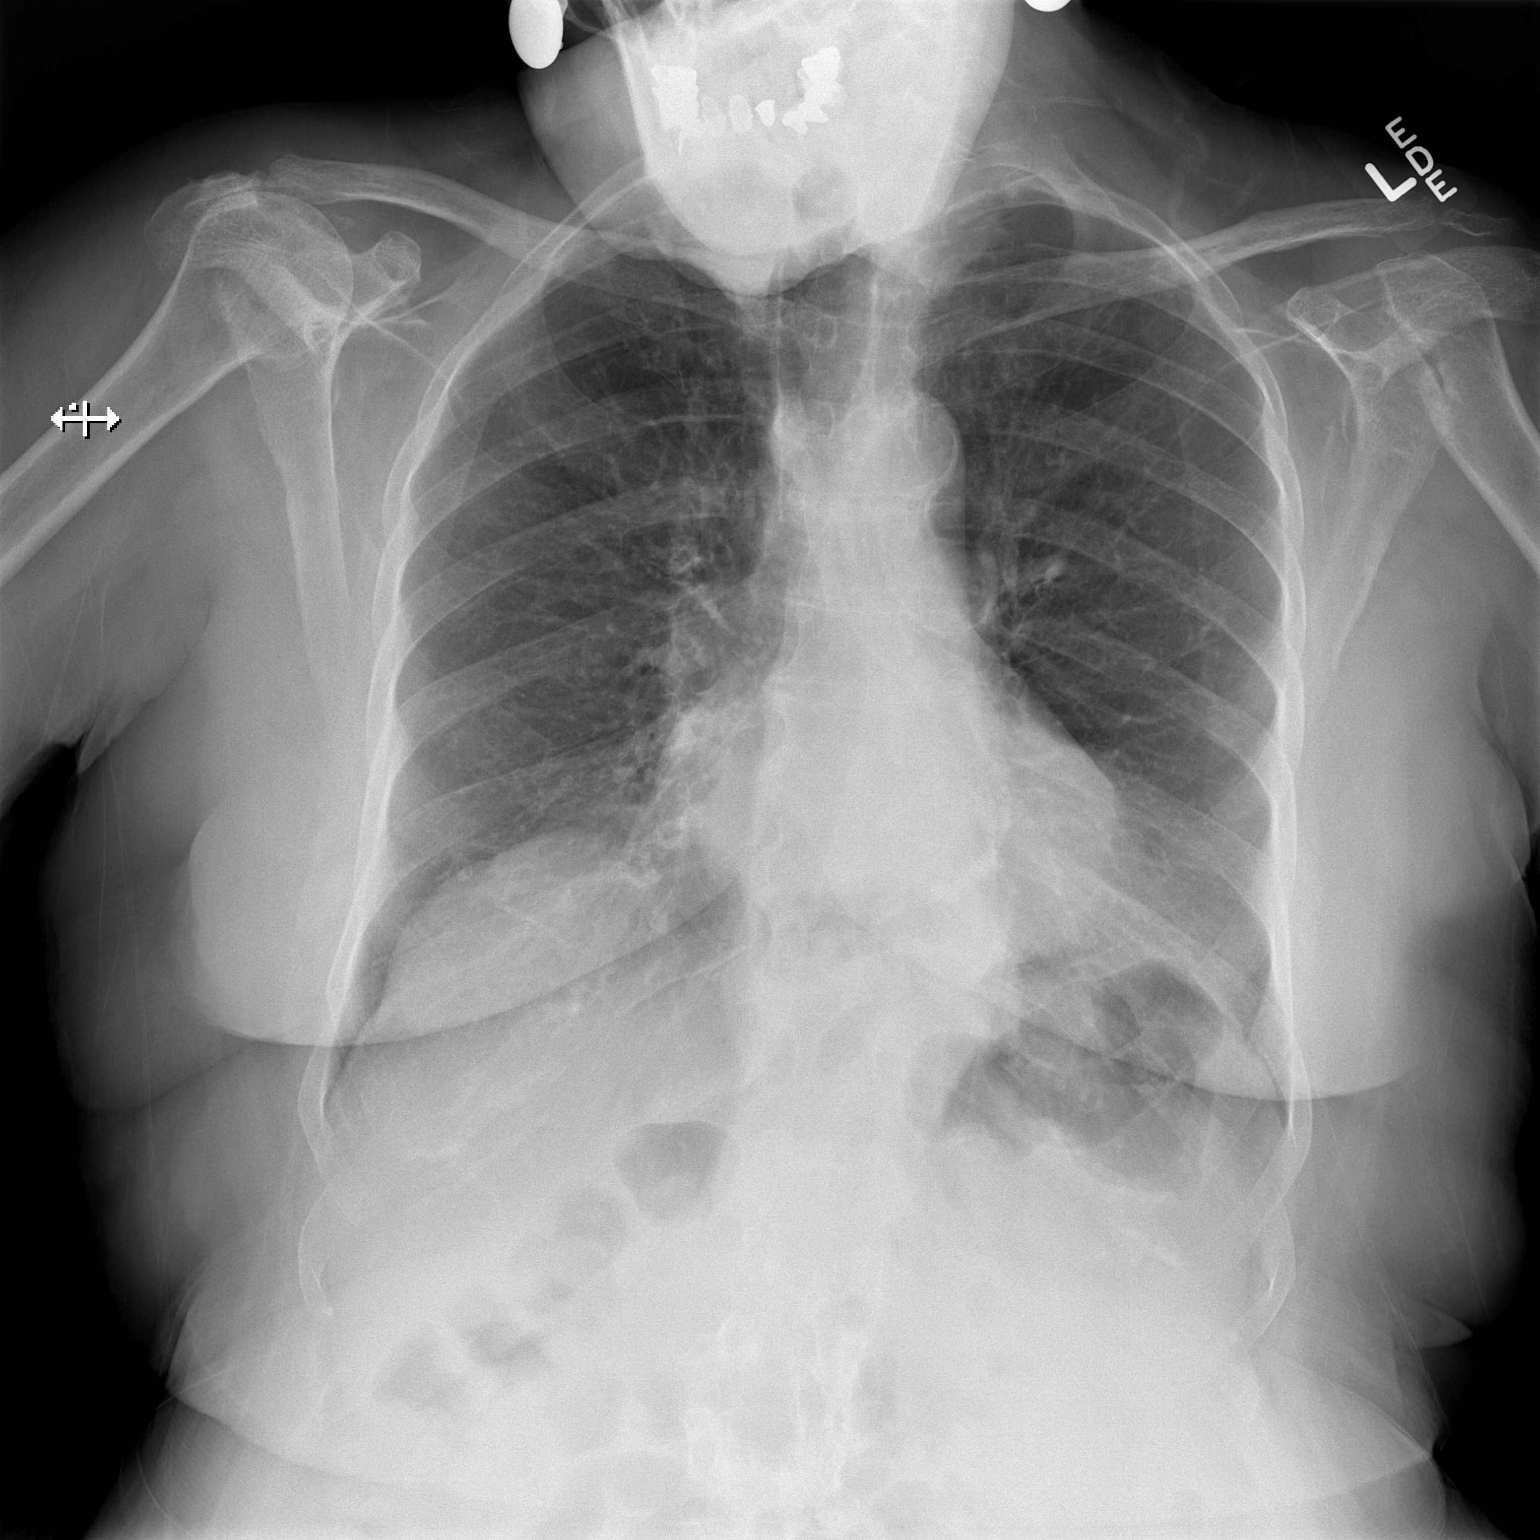

[w chest lat]
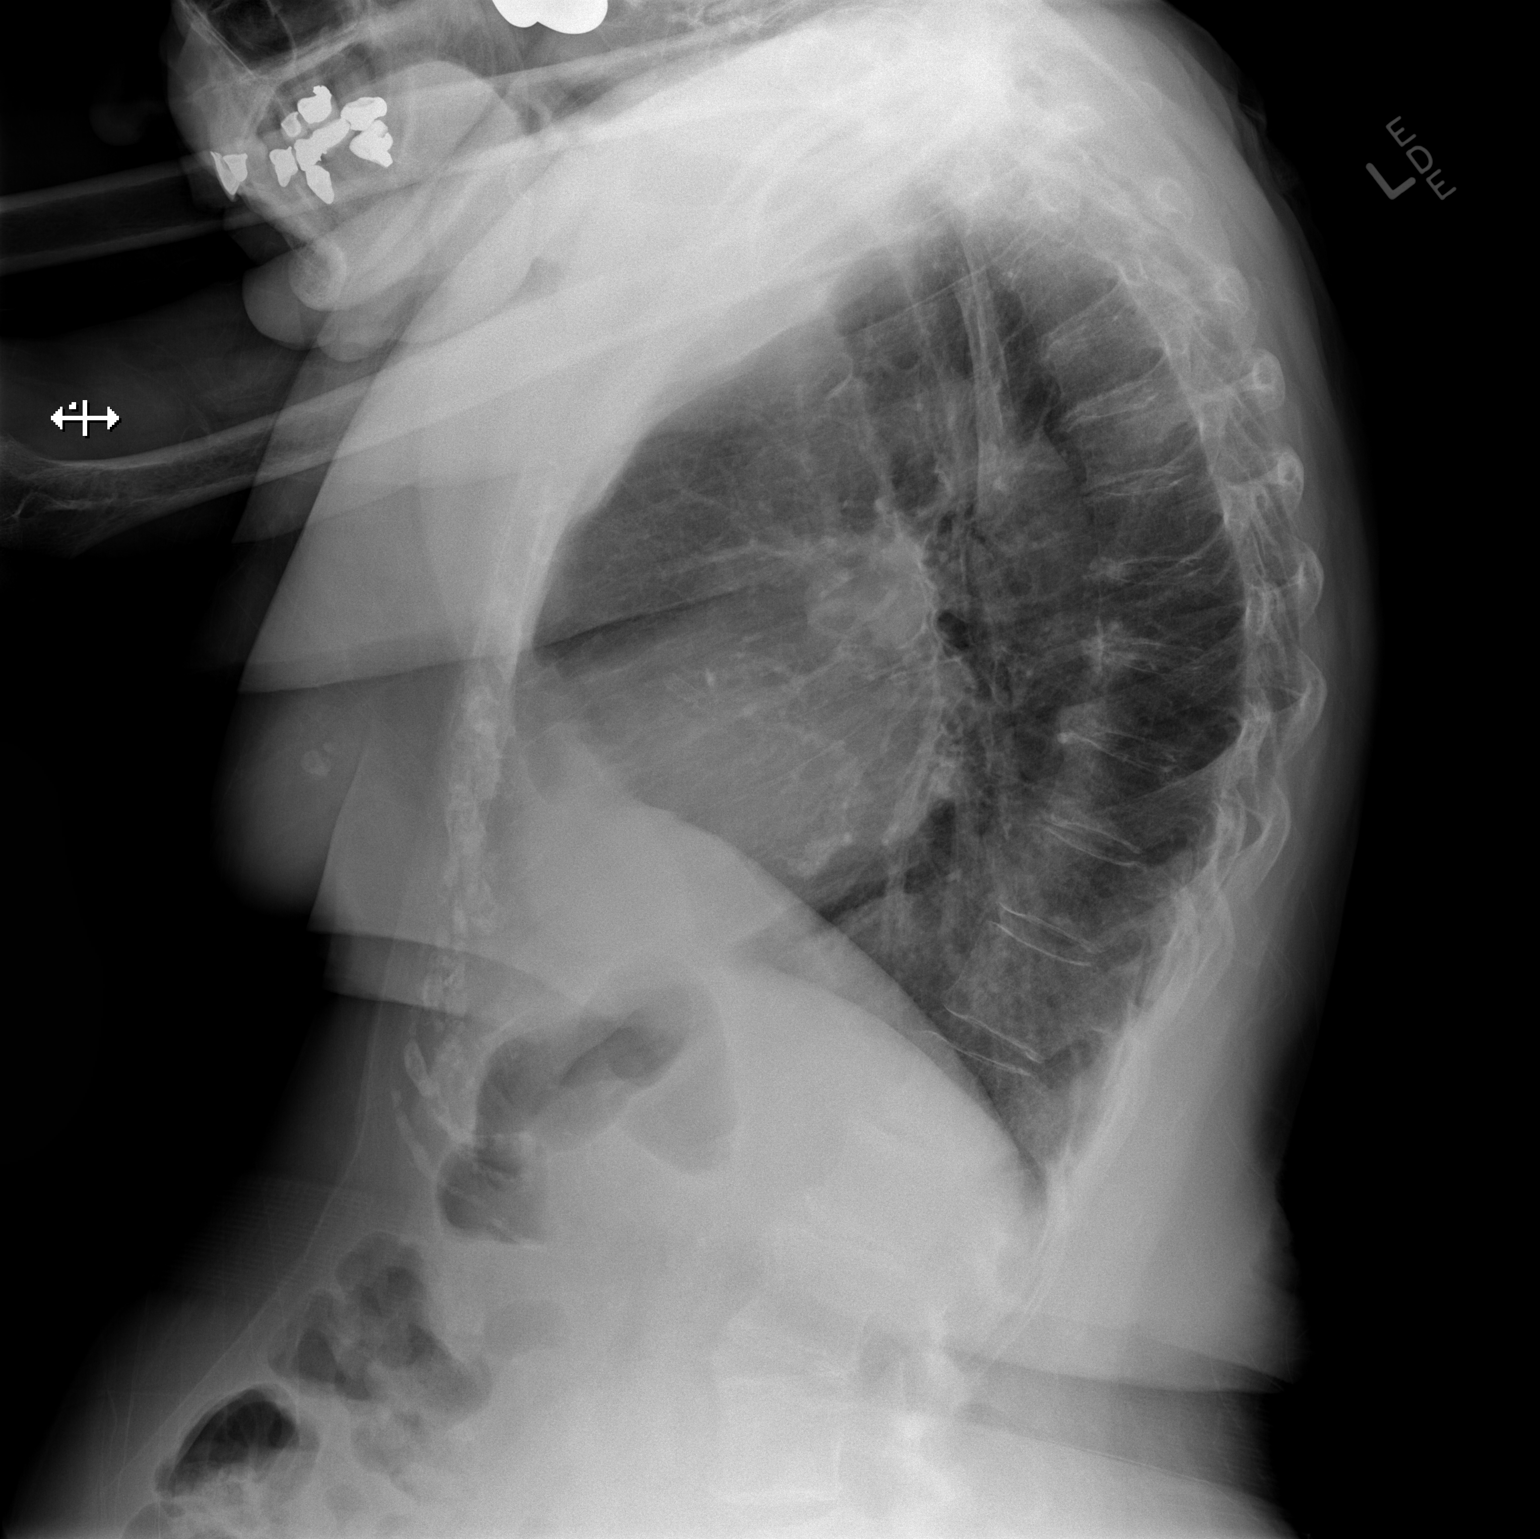

[2 of 2 positions shown; findings below may reference images not displayed]

FINDINGS: The cardiac silhouette in the upper limits normal. Partially
calcified mitral annulus is identified. No focal reason
consolidation or focal infiltrates. The osseous structures are
unremarkable. Low lung volumes. Chronic compression deformity is
identified within the mid thoracic spine.
IMPRESSION: No evidence of acute cardiopulmonary disease.

## 2014-04-21 DIAGNOSIS — I059 Rheumatic mitral valve disease, unspecified: Secondary | ICD-10-CM | POA: Diagnosis not present

## 2014-04-21 DIAGNOSIS — I2510 Atherosclerotic heart disease of native coronary artery without angina pectoris: Secondary | ICD-10-CM | POA: Diagnosis not present

## 2014-04-21 DIAGNOSIS — I359 Nonrheumatic aortic valve disorder, unspecified: Secondary | ICD-10-CM | POA: Diagnosis not present

## 2014-05-19 DIAGNOSIS — Z23 Encounter for immunization: Secondary | ICD-10-CM | POA: Diagnosis not present

## 2014-06-25 DIAGNOSIS — L821 Other seborrheic keratosis: Secondary | ICD-10-CM | POA: Diagnosis not present

## 2014-06-25 DIAGNOSIS — L570 Actinic keratosis: Secondary | ICD-10-CM | POA: Diagnosis not present

## 2014-07-21 DIAGNOSIS — I10 Essential (primary) hypertension: Secondary | ICD-10-CM | POA: Diagnosis not present

## 2014-07-21 DIAGNOSIS — M1990 Unspecified osteoarthritis, unspecified site: Secondary | ICD-10-CM | POA: Diagnosis not present

## 2014-07-21 DIAGNOSIS — Z79899 Other long term (current) drug therapy: Secondary | ICD-10-CM | POA: Diagnosis not present

## 2014-07-21 DIAGNOSIS — Z0000 Encounter for general adult medical examination without abnormal findings: Secondary | ICD-10-CM | POA: Diagnosis not present

## 2014-07-21 DIAGNOSIS — D649 Anemia, unspecified: Secondary | ICD-10-CM | POA: Diagnosis not present

## 2014-07-21 DIAGNOSIS — E780 Pure hypercholesterolemia: Secondary | ICD-10-CM | POA: Diagnosis not present

## 2014-07-21 DIAGNOSIS — R300 Dysuria: Secondary | ICD-10-CM | POA: Diagnosis not present

## 2014-07-21 DIAGNOSIS — Z23 Encounter for immunization: Secondary | ICD-10-CM | POA: Diagnosis not present

## 2015-02-02 DIAGNOSIS — H52223 Regular astigmatism, bilateral: Secondary | ICD-10-CM | POA: Diagnosis not present

## 2015-02-02 DIAGNOSIS — H43313 Vitreous membranes and strands, bilateral: Secondary | ICD-10-CM | POA: Diagnosis not present

## 2015-02-02 DIAGNOSIS — Z9842 Cataract extraction status, left eye: Secondary | ICD-10-CM | POA: Diagnosis not present

## 2015-02-02 DIAGNOSIS — H524 Presbyopia: Secondary | ICD-10-CM | POA: Diagnosis not present

## 2015-02-02 DIAGNOSIS — H59091 Other disorders of the right eye following cataract surgery: Secondary | ICD-10-CM | POA: Diagnosis not present

## 2015-02-02 DIAGNOSIS — H5213 Myopia, bilateral: Secondary | ICD-10-CM | POA: Diagnosis not present

## 2015-02-02 DIAGNOSIS — I10 Essential (primary) hypertension: Secondary | ICD-10-CM | POA: Diagnosis not present

## 2015-02-02 DIAGNOSIS — H43813 Vitreous degeneration, bilateral: Secondary | ICD-10-CM | POA: Diagnosis not present

## 2015-02-02 DIAGNOSIS — H18421 Band keratopathy, right eye: Secondary | ICD-10-CM | POA: Diagnosis not present

## 2015-02-02 DIAGNOSIS — H1840 Unspecified corneal degeneration: Secondary | ICD-10-CM | POA: Diagnosis not present

## 2015-03-01 DIAGNOSIS — F4321 Adjustment disorder with depressed mood: Secondary | ICD-10-CM | POA: Diagnosis not present

## 2015-03-01 DIAGNOSIS — I10 Essential (primary) hypertension: Secondary | ICD-10-CM | POA: Diagnosis not present

## 2015-03-01 DIAGNOSIS — Z79899 Other long term (current) drug therapy: Secondary | ICD-10-CM | POA: Diagnosis not present

## 2015-03-01 DIAGNOSIS — E780 Pure hypercholesterolemia: Secondary | ICD-10-CM | POA: Diagnosis not present

## 2015-03-01 DIAGNOSIS — Z1389 Encounter for screening for other disorder: Secondary | ICD-10-CM | POA: Diagnosis not present

## 2015-05-21 DIAGNOSIS — H26491 Other secondary cataract, right eye: Secondary | ICD-10-CM | POA: Diagnosis not present

## 2015-06-01 DIAGNOSIS — I10 Essential (primary) hypertension: Secondary | ICD-10-CM | POA: Diagnosis not present

## 2015-06-01 DIAGNOSIS — Z9841 Cataract extraction status, right eye: Secondary | ICD-10-CM | POA: Diagnosis not present

## 2015-06-01 DIAGNOSIS — H52223 Regular astigmatism, bilateral: Secondary | ICD-10-CM | POA: Diagnosis not present

## 2015-06-01 DIAGNOSIS — H26491 Other secondary cataract, right eye: Secondary | ICD-10-CM | POA: Diagnosis not present

## 2015-06-01 DIAGNOSIS — Z961 Presence of intraocular lens: Secondary | ICD-10-CM | POA: Diagnosis not present

## 2015-06-01 DIAGNOSIS — H524 Presbyopia: Secondary | ICD-10-CM | POA: Diagnosis not present

## 2015-06-01 DIAGNOSIS — H5212 Myopia, left eye: Secondary | ICD-10-CM | POA: Diagnosis not present

## 2015-06-07 DIAGNOSIS — Z23 Encounter for immunization: Secondary | ICD-10-CM | POA: Diagnosis not present

## 2015-06-14 DIAGNOSIS — I5032 Chronic diastolic (congestive) heart failure: Secondary | ICD-10-CM | POA: Diagnosis not present

## 2015-06-14 DIAGNOSIS — E784 Other hyperlipidemia: Secondary | ICD-10-CM | POA: Diagnosis not present

## 2015-06-14 DIAGNOSIS — I350 Nonrheumatic aortic (valve) stenosis: Secondary | ICD-10-CM | POA: Diagnosis not present

## 2015-06-14 DIAGNOSIS — I110 Hypertensive heart disease with heart failure: Secondary | ICD-10-CM | POA: Diagnosis not present

## 2015-06-14 DIAGNOSIS — I2510 Atherosclerotic heart disease of native coronary artery without angina pectoris: Secondary | ICD-10-CM | POA: Diagnosis not present

## 2015-06-14 DIAGNOSIS — I509 Heart failure, unspecified: Secondary | ICD-10-CM | POA: Diagnosis not present

## 2015-06-28 DIAGNOSIS — L821 Other seborrheic keratosis: Secondary | ICD-10-CM | POA: Diagnosis not present

## 2015-06-28 DIAGNOSIS — L570 Actinic keratosis: Secondary | ICD-10-CM | POA: Diagnosis not present

## 2015-06-28 DIAGNOSIS — L578 Other skin changes due to chronic exposure to nonionizing radiation: Secondary | ICD-10-CM | POA: Diagnosis not present

## 2015-07-12 DIAGNOSIS — I129 Hypertensive chronic kidney disease with stage 1 through stage 4 chronic kidney disease, or unspecified chronic kidney disease: Secondary | ICD-10-CM | POA: Diagnosis not present

## 2015-07-12 DIAGNOSIS — S12100A Unspecified displaced fracture of second cervical vertebra, initial encounter for closed fracture: Secondary | ICD-10-CM | POA: Diagnosis not present

## 2015-07-12 DIAGNOSIS — E049 Nontoxic goiter, unspecified: Secondary | ICD-10-CM | POA: Diagnosis not present

## 2015-07-12 DIAGNOSIS — R269 Unspecified abnormalities of gait and mobility: Secondary | ICD-10-CM | POA: Diagnosis not present

## 2015-07-12 DIAGNOSIS — M549 Dorsalgia, unspecified: Secondary | ICD-10-CM | POA: Diagnosis not present

## 2015-07-12 DIAGNOSIS — R05 Cough: Secondary | ICD-10-CM | POA: Diagnosis not present

## 2015-07-12 DIAGNOSIS — M25572 Pain in left ankle and joints of left foot: Secondary | ICD-10-CM | POA: Diagnosis not present

## 2015-07-12 DIAGNOSIS — M4312 Spondylolisthesis, cervical region: Secondary | ICD-10-CM | POA: Diagnosis not present

## 2015-07-12 DIAGNOSIS — M4806 Spinal stenosis, lumbar region: Secondary | ICD-10-CM | POA: Diagnosis not present

## 2015-07-12 DIAGNOSIS — R918 Other nonspecific abnormal finding of lung field: Secondary | ICD-10-CM | POA: Diagnosis not present

## 2015-07-12 DIAGNOSIS — M2578 Osteophyte, vertebrae: Secondary | ICD-10-CM | POA: Diagnosis not present

## 2015-07-12 DIAGNOSIS — M5136 Other intervertebral disc degeneration, lumbar region: Secondary | ICD-10-CM | POA: Diagnosis not present

## 2015-07-12 DIAGNOSIS — N189 Chronic kidney disease, unspecified: Secondary | ICD-10-CM | POA: Diagnosis not present

## 2015-07-12 DIAGNOSIS — E042 Nontoxic multinodular goiter: Secondary | ICD-10-CM | POA: Diagnosis not present

## 2015-07-12 DIAGNOSIS — W19XXXA Unspecified fall, initial encounter: Secondary | ICD-10-CM | POA: Diagnosis not present

## 2015-07-12 DIAGNOSIS — M546 Pain in thoracic spine: Secondary | ICD-10-CM | POA: Diagnosis not present

## 2015-07-12 DIAGNOSIS — I2510 Atherosclerotic heart disease of native coronary artery without angina pectoris: Secondary | ICD-10-CM | POA: Diagnosis not present

## 2015-07-12 DIAGNOSIS — K449 Diaphragmatic hernia without obstruction or gangrene: Secondary | ICD-10-CM | POA: Diagnosis not present

## 2015-07-12 DIAGNOSIS — M5134 Other intervertebral disc degeneration, thoracic region: Secondary | ICD-10-CM | POA: Diagnosis not present

## 2015-07-12 DIAGNOSIS — I4891 Unspecified atrial fibrillation: Secondary | ICD-10-CM | POA: Diagnosis not present

## 2015-07-12 DIAGNOSIS — I6501 Occlusion and stenosis of right vertebral artery: Secondary | ICD-10-CM | POA: Diagnosis not present

## 2015-07-12 DIAGNOSIS — R531 Weakness: Secondary | ICD-10-CM | POA: Diagnosis not present

## 2015-07-12 DIAGNOSIS — M50322 Other cervical disc degeneration at C5-C6 level: Secondary | ICD-10-CM | POA: Diagnosis not present

## 2015-07-12 DIAGNOSIS — S12190A Other displaced fracture of second cervical vertebra, initial encounter for closed fracture: Secondary | ICD-10-CM | POA: Diagnosis not present

## 2015-07-12 DIAGNOSIS — M542 Cervicalgia: Secondary | ICD-10-CM | POA: Diagnosis not present

## 2015-07-12 DIAGNOSIS — Z7901 Long term (current) use of anticoagulants: Secondary | ICD-10-CM | POA: Diagnosis not present

## 2015-07-12 DIAGNOSIS — G311 Senile degeneration of brain, not elsewhere classified: Secondary | ICD-10-CM | POA: Diagnosis not present

## 2015-07-15 DIAGNOSIS — J40 Bronchitis, not specified as acute or chronic: Secondary | ICD-10-CM | POA: Diagnosis not present

## 2015-07-15 DIAGNOSIS — S129XXA Fracture of neck, unspecified, initial encounter: Secondary | ICD-10-CM | POA: Diagnosis not present

## 2015-07-16 DIAGNOSIS — S12100A Unspecified displaced fracture of second cervical vertebra, initial encounter for closed fracture: Secondary | ICD-10-CM | POA: Diagnosis not present

## 2015-07-19 DIAGNOSIS — I482 Chronic atrial fibrillation: Secondary | ICD-10-CM | POA: Diagnosis not present

## 2015-07-19 DIAGNOSIS — D649 Anemia, unspecified: Secondary | ICD-10-CM | POA: Diagnosis not present

## 2015-07-19 DIAGNOSIS — Z79899 Other long term (current) drug therapy: Secondary | ICD-10-CM | POA: Diagnosis not present

## 2015-07-19 DIAGNOSIS — Z888 Allergy status to other drugs, medicaments and biological substances status: Secondary | ICD-10-CM | POA: Diagnosis not present

## 2015-07-19 DIAGNOSIS — R279 Unspecified lack of coordination: Secondary | ICD-10-CM | POA: Diagnosis not present

## 2015-07-19 DIAGNOSIS — I480 Paroxysmal atrial fibrillation: Secondary | ICD-10-CM | POA: Diagnosis not present

## 2015-07-19 DIAGNOSIS — E860 Dehydration: Secondary | ICD-10-CM | POA: Diagnosis not present

## 2015-07-19 DIAGNOSIS — E7800 Pure hypercholesterolemia, unspecified: Secondary | ICD-10-CM | POA: Diagnosis present

## 2015-07-19 DIAGNOSIS — E871 Hypo-osmolality and hyponatremia: Secondary | ICD-10-CM | POA: Diagnosis not present

## 2015-07-19 DIAGNOSIS — Z7401 Bed confinement status: Secondary | ICD-10-CM | POA: Diagnosis not present

## 2015-07-19 DIAGNOSIS — R262 Difficulty in walking, not elsewhere classified: Secondary | ICD-10-CM | POA: Diagnosis present

## 2015-07-19 DIAGNOSIS — J180 Bronchopneumonia, unspecified organism: Secondary | ICD-10-CM | POA: Diagnosis not present

## 2015-07-19 DIAGNOSIS — S12190D Other displaced fracture of second cervical vertebra, subsequent encounter for fracture with routine healing: Secondary | ICD-10-CM | POA: Diagnosis not present

## 2015-07-19 DIAGNOSIS — M6281 Muscle weakness (generalized): Secondary | ICD-10-CM | POA: Diagnosis not present

## 2015-07-19 DIAGNOSIS — N3001 Acute cystitis with hematuria: Secondary | ICD-10-CM | POA: Diagnosis not present

## 2015-07-19 DIAGNOSIS — Z882 Allergy status to sulfonamides status: Secondary | ICD-10-CM | POA: Diagnosis not present

## 2015-07-19 DIAGNOSIS — R531 Weakness: Secondary | ICD-10-CM | POA: Diagnosis not present

## 2015-07-19 DIAGNOSIS — J9690 Respiratory failure, unspecified, unspecified whether with hypoxia or hypercapnia: Secondary | ICD-10-CM | POA: Diagnosis not present

## 2015-07-19 DIAGNOSIS — M810 Age-related osteoporosis without current pathological fracture: Secondary | ICD-10-CM | POA: Diagnosis present

## 2015-07-19 DIAGNOSIS — F329 Major depressive disorder, single episode, unspecified: Secondary | ICD-10-CM | POA: Diagnosis present

## 2015-07-19 DIAGNOSIS — E782 Mixed hyperlipidemia: Secondary | ICD-10-CM | POA: Diagnosis present

## 2015-07-19 DIAGNOSIS — E785 Hyperlipidemia, unspecified: Secondary | ICD-10-CM | POA: Diagnosis not present

## 2015-07-19 DIAGNOSIS — I2510 Atherosclerotic heart disease of native coronary artery without angina pectoris: Secondary | ICD-10-CM | POA: Diagnosis present

## 2015-07-19 DIAGNOSIS — K219 Gastro-esophageal reflux disease without esophagitis: Secondary | ICD-10-CM | POA: Diagnosis present

## 2015-07-19 DIAGNOSIS — S12190A Other displaced fracture of second cervical vertebra, initial encounter for closed fracture: Secondary | ICD-10-CM | POA: Diagnosis not present

## 2015-07-19 DIAGNOSIS — I10 Essential (primary) hypertension: Secondary | ICD-10-CM | POA: Diagnosis not present

## 2015-07-19 DIAGNOSIS — M1990 Unspecified osteoarthritis, unspecified site: Secondary | ICD-10-CM | POA: Diagnosis present

## 2015-07-19 DIAGNOSIS — I4891 Unspecified atrial fibrillation: Secondary | ICD-10-CM | POA: Diagnosis not present

## 2015-07-19 DIAGNOSIS — I252 Old myocardial infarction: Secondary | ICD-10-CM | POA: Diagnosis not present

## 2015-07-19 DIAGNOSIS — S12001A Unspecified nondisplaced fracture of first cervical vertebra, initial encounter for closed fracture: Secondary | ICD-10-CM | POA: Diagnosis not present

## 2015-07-19 DIAGNOSIS — E875 Hyperkalemia: Secondary | ICD-10-CM | POA: Diagnosis not present

## 2015-07-19 DIAGNOSIS — Z8673 Personal history of transient ischemic attack (TIA), and cerebral infarction without residual deficits: Secondary | ICD-10-CM | POA: Diagnosis not present

## 2015-07-19 DIAGNOSIS — N390 Urinary tract infection, site not specified: Secondary | ICD-10-CM | POA: Diagnosis not present

## 2015-07-22 DIAGNOSIS — K921 Melena: Secondary | ICD-10-CM | POA: Diagnosis not present

## 2015-07-22 DIAGNOSIS — S12001A Unspecified nondisplaced fracture of first cervical vertebra, initial encounter for closed fracture: Secondary | ICD-10-CM | POA: Diagnosis not present

## 2015-07-22 DIAGNOSIS — E860 Dehydration: Secondary | ICD-10-CM | POA: Diagnosis not present

## 2015-07-22 DIAGNOSIS — R600 Localized edema: Secondary | ICD-10-CM | POA: Diagnosis not present

## 2015-07-22 DIAGNOSIS — Z7401 Bed confinement status: Secondary | ICD-10-CM | POA: Diagnosis not present

## 2015-07-22 DIAGNOSIS — D638 Anemia in other chronic diseases classified elsewhere: Secondary | ICD-10-CM | POA: Diagnosis not present

## 2015-07-22 DIAGNOSIS — M6281 Muscle weakness (generalized): Secondary | ICD-10-CM | POA: Diagnosis not present

## 2015-07-22 DIAGNOSIS — N390 Urinary tract infection, site not specified: Secondary | ICD-10-CM | POA: Diagnosis not present

## 2015-07-22 DIAGNOSIS — I480 Paroxysmal atrial fibrillation: Secondary | ICD-10-CM | POA: Diagnosis not present

## 2015-07-22 DIAGNOSIS — K51211 Ulcerative (chronic) proctitis with rectal bleeding: Secondary | ICD-10-CM | POA: Diagnosis not present

## 2015-07-22 DIAGNOSIS — K219 Gastro-esophageal reflux disease without esophagitis: Secondary | ICD-10-CM | POA: Diagnosis not present

## 2015-07-22 DIAGNOSIS — J180 Bronchopneumonia, unspecified organism: Secondary | ICD-10-CM | POA: Diagnosis not present

## 2015-07-22 DIAGNOSIS — I4891 Unspecified atrial fibrillation: Secondary | ICD-10-CM | POA: Diagnosis not present

## 2015-07-22 DIAGNOSIS — R197 Diarrhea, unspecified: Secondary | ICD-10-CM | POA: Diagnosis not present

## 2015-07-22 DIAGNOSIS — R279 Unspecified lack of coordination: Secondary | ICD-10-CM | POA: Diagnosis not present

## 2015-07-22 DIAGNOSIS — R079 Chest pain, unspecified: Secondary | ICD-10-CM | POA: Diagnosis not present

## 2015-07-22 DIAGNOSIS — E871 Hypo-osmolality and hyponatremia: Secondary | ICD-10-CM | POA: Diagnosis not present

## 2015-07-22 DIAGNOSIS — D649 Anemia, unspecified: Secondary | ICD-10-CM | POA: Diagnosis not present

## 2015-07-22 DIAGNOSIS — N3001 Acute cystitis with hematuria: Secondary | ICD-10-CM | POA: Diagnosis not present

## 2015-07-22 DIAGNOSIS — S12190D Other displaced fracture of second cervical vertebra, subsequent encounter for fracture with routine healing: Secondary | ICD-10-CM | POA: Diagnosis not present

## 2015-07-22 DIAGNOSIS — E875 Hyperkalemia: Secondary | ICD-10-CM | POA: Diagnosis not present

## 2015-07-22 DIAGNOSIS — R262 Difficulty in walking, not elsewhere classified: Secondary | ICD-10-CM | POA: Diagnosis not present

## 2015-07-22 DIAGNOSIS — E785 Hyperlipidemia, unspecified: Secondary | ICD-10-CM | POA: Diagnosis not present

## 2015-07-22 DIAGNOSIS — I10 Essential (primary) hypertension: Secondary | ICD-10-CM | POA: Diagnosis not present

## 2015-07-26 DIAGNOSIS — E871 Hypo-osmolality and hyponatremia: Secondary | ICD-10-CM | POA: Diagnosis not present

## 2015-07-26 DIAGNOSIS — S12190D Other displaced fracture of second cervical vertebra, subsequent encounter for fracture with routine healing: Secondary | ICD-10-CM | POA: Diagnosis not present

## 2015-07-26 DIAGNOSIS — J180 Bronchopneumonia, unspecified organism: Secondary | ICD-10-CM | POA: Diagnosis not present

## 2015-07-26 DIAGNOSIS — I10 Essential (primary) hypertension: Secondary | ICD-10-CM | POA: Diagnosis not present

## 2015-07-29 DIAGNOSIS — D649 Anemia, unspecified: Secondary | ICD-10-CM | POA: Diagnosis not present

## 2015-07-30 DIAGNOSIS — D649 Anemia, unspecified: Secondary | ICD-10-CM | POA: Diagnosis not present

## 2015-08-05 DIAGNOSIS — E860 Dehydration: Secondary | ICD-10-CM | POA: Diagnosis not present

## 2015-08-05 DIAGNOSIS — R079 Chest pain, unspecified: Secondary | ICD-10-CM | POA: Diagnosis not present

## 2015-08-05 DIAGNOSIS — K921 Melena: Secondary | ICD-10-CM | POA: Diagnosis not present

## 2015-08-06 DIAGNOSIS — R600 Localized edema: Secondary | ICD-10-CM | POA: Diagnosis not present

## 2015-08-06 DIAGNOSIS — K921 Melena: Secondary | ICD-10-CM | POA: Diagnosis not present

## 2015-08-06 DIAGNOSIS — R197 Diarrhea, unspecified: Secondary | ICD-10-CM | POA: Diagnosis not present

## 2015-08-11 DIAGNOSIS — R600 Localized edema: Secondary | ICD-10-CM | POA: Diagnosis not present

## 2015-08-11 DIAGNOSIS — I4891 Unspecified atrial fibrillation: Secondary | ICD-10-CM | POA: Diagnosis not present

## 2015-08-11 DIAGNOSIS — D638 Anemia in other chronic diseases classified elsewhere: Secondary | ICD-10-CM | POA: Diagnosis not present

## 2015-08-11 DIAGNOSIS — E871 Hypo-osmolality and hyponatremia: Secondary | ICD-10-CM | POA: Diagnosis not present

## 2015-08-12 DIAGNOSIS — I10 Essential (primary) hypertension: Secondary | ICD-10-CM | POA: Diagnosis not present

## 2015-08-17 DIAGNOSIS — S12190D Other displaced fracture of second cervical vertebra, subsequent encounter for fracture with routine healing: Secondary | ICD-10-CM | POA: Diagnosis not present

## 2015-08-17 DIAGNOSIS — S12191D Other nondisplaced fracture of second cervical vertebra, subsequent encounter for fracture with routine healing: Secondary | ICD-10-CM | POA: Diagnosis not present

## 2015-08-30 DIAGNOSIS — E871 Hypo-osmolality and hyponatremia: Secondary | ICD-10-CM | POA: Diagnosis not present

## 2015-08-30 DIAGNOSIS — D649 Anemia, unspecified: Secondary | ICD-10-CM | POA: Diagnosis not present

## 2015-08-30 DIAGNOSIS — Z9181 History of falling: Secondary | ICD-10-CM | POA: Diagnosis not present

## 2015-09-01 DIAGNOSIS — S62102A Fracture of unspecified carpal bone, left wrist, initial encounter for closed fracture: Secondary | ICD-10-CM | POA: Diagnosis not present

## 2015-09-01 DIAGNOSIS — S59902A Unspecified injury of left elbow, initial encounter: Secondary | ICD-10-CM | POA: Diagnosis not present

## 2015-09-01 DIAGNOSIS — S52592D Other fractures of lower end of left radius, subsequent encounter for closed fracture with routine healing: Secondary | ICD-10-CM | POA: Diagnosis not present

## 2015-09-01 DIAGNOSIS — S52592A Other fractures of lower end of left radius, initial encounter for closed fracture: Secondary | ICD-10-CM | POA: Diagnosis not present

## 2015-09-02 DIAGNOSIS — E871 Hypo-osmolality and hyponatremia: Secondary | ICD-10-CM | POA: Diagnosis not present

## 2015-09-02 DIAGNOSIS — E041 Nontoxic single thyroid nodule: Secondary | ICD-10-CM | POA: Diagnosis not present

## 2015-09-03 DIAGNOSIS — E871 Hypo-osmolality and hyponatremia: Secondary | ICD-10-CM | POA: Diagnosis not present

## 2015-09-03 DIAGNOSIS — E041 Nontoxic single thyroid nodule: Secondary | ICD-10-CM | POA: Diagnosis not present

## 2015-09-03 DIAGNOSIS — E119 Type 2 diabetes mellitus without complications: Secondary | ICD-10-CM | POA: Diagnosis not present

## 2015-09-08 DIAGNOSIS — S52502A Unspecified fracture of the lower end of left radius, initial encounter for closed fracture: Secondary | ICD-10-CM | POA: Diagnosis not present

## 2015-09-09 DIAGNOSIS — E041 Nontoxic single thyroid nodule: Secondary | ICD-10-CM | POA: Diagnosis not present

## 2015-09-15 DIAGNOSIS — S52532D Colles' fracture of left radius, subsequent encounter for closed fracture with routine healing: Secondary | ICD-10-CM | POA: Diagnosis not present

## 2015-09-29 DIAGNOSIS — S52532D Colles' fracture of left radius, subsequent encounter for closed fracture with routine healing: Secondary | ICD-10-CM | POA: Diagnosis not present

## 2015-10-06 DIAGNOSIS — D649 Anemia, unspecified: Secondary | ICD-10-CM | POA: Diagnosis not present

## 2015-10-18 DIAGNOSIS — D649 Anemia, unspecified: Secondary | ICD-10-CM | POA: Diagnosis not present

## 2015-10-22 DIAGNOSIS — S52532D Colles' fracture of left radius, subsequent encounter for closed fracture with routine healing: Secondary | ICD-10-CM | POA: Diagnosis not present

## 2015-11-18 DIAGNOSIS — D649 Anemia, unspecified: Secondary | ICD-10-CM | POA: Diagnosis not present

## 2015-12-16 DIAGNOSIS — D649 Anemia, unspecified: Secondary | ICD-10-CM | POA: Diagnosis not present

## 2016-01-24 DIAGNOSIS — I2510 Atherosclerotic heart disease of native coronary artery without angina pectoris: Secondary | ICD-10-CM | POA: Diagnosis not present

## 2016-01-24 DIAGNOSIS — I509 Heart failure, unspecified: Secondary | ICD-10-CM | POA: Diagnosis not present

## 2016-01-24 DIAGNOSIS — N183 Chronic kidney disease, stage 3 (moderate): Secondary | ICD-10-CM | POA: Diagnosis not present

## 2016-01-24 DIAGNOSIS — I5032 Chronic diastolic (congestive) heart failure: Secondary | ICD-10-CM | POA: Diagnosis not present

## 2016-01-24 DIAGNOSIS — I480 Paroxysmal atrial fibrillation: Secondary | ICD-10-CM | POA: Diagnosis not present

## 2016-01-24 DIAGNOSIS — I110 Hypertensive heart disease with heart failure: Secondary | ICD-10-CM | POA: Diagnosis not present

## 2016-01-24 DIAGNOSIS — E785 Hyperlipidemia, unspecified: Secondary | ICD-10-CM | POA: Diagnosis not present

## 2016-01-24 DIAGNOSIS — Z7901 Long term (current) use of anticoagulants: Secondary | ICD-10-CM | POA: Diagnosis not present

## 2016-02-28 DIAGNOSIS — Z79899 Other long term (current) drug therapy: Secondary | ICD-10-CM | POA: Diagnosis not present

## 2016-02-28 DIAGNOSIS — I10 Essential (primary) hypertension: Secondary | ICD-10-CM | POA: Diagnosis not present

## 2016-02-28 DIAGNOSIS — E785 Hyperlipidemia, unspecified: Secondary | ICD-10-CM | POA: Diagnosis not present

## 2016-02-28 DIAGNOSIS — Z0000 Encounter for general adult medical examination without abnormal findings: Secondary | ICD-10-CM | POA: Diagnosis not present

## 2016-02-28 DIAGNOSIS — S41102S Unspecified open wound of left upper arm, sequela: Secondary | ICD-10-CM | POA: Diagnosis not present

## 2016-04-13 DIAGNOSIS — H524 Presbyopia: Secondary | ICD-10-CM | POA: Diagnosis not present

## 2016-04-13 DIAGNOSIS — H04121 Dry eye syndrome of right lacrimal gland: Secondary | ICD-10-CM | POA: Diagnosis not present

## 2016-04-13 DIAGNOSIS — Z9841 Cataract extraction status, right eye: Secondary | ICD-10-CM | POA: Diagnosis not present

## 2016-04-13 DIAGNOSIS — H5211 Myopia, right eye: Secondary | ICD-10-CM | POA: Diagnosis not present

## 2016-04-13 DIAGNOSIS — I10 Essential (primary) hypertension: Secondary | ICD-10-CM | POA: Diagnosis not present

## 2016-04-13 DIAGNOSIS — Z961 Presence of intraocular lens: Secondary | ICD-10-CM | POA: Diagnosis not present

## 2016-04-13 DIAGNOSIS — Z9842 Cataract extraction status, left eye: Secondary | ICD-10-CM | POA: Diagnosis not present

## 2016-04-13 DIAGNOSIS — H43813 Vitreous degeneration, bilateral: Secondary | ICD-10-CM | POA: Diagnosis not present

## 2016-04-13 DIAGNOSIS — H43393 Other vitreous opacities, bilateral: Secondary | ICD-10-CM | POA: Diagnosis not present

## 2016-04-13 DIAGNOSIS — H52223 Regular astigmatism, bilateral: Secondary | ICD-10-CM | POA: Diagnosis not present

## 2016-04-13 DIAGNOSIS — H16221 Keratoconjunctivitis sicca, not specified as Sjogren's, right eye: Secondary | ICD-10-CM | POA: Diagnosis not present

## 2016-05-09 DIAGNOSIS — R51 Headache: Secondary | ICD-10-CM | POA: Diagnosis not present

## 2016-05-09 DIAGNOSIS — S0083XA Contusion of other part of head, initial encounter: Secondary | ICD-10-CM | POA: Diagnosis not present

## 2016-05-09 DIAGNOSIS — Z7901 Long term (current) use of anticoagulants: Secondary | ICD-10-CM | POA: Diagnosis not present

## 2016-05-09 DIAGNOSIS — S0990XA Unspecified injury of head, initial encounter: Secondary | ICD-10-CM | POA: Diagnosis not present

## 2016-05-09 DIAGNOSIS — S51811A Laceration without foreign body of right forearm, initial encounter: Secondary | ICD-10-CM | POA: Diagnosis not present

## 2016-05-09 DIAGNOSIS — S51011A Laceration without foreign body of right elbow, initial encounter: Secondary | ICD-10-CM | POA: Diagnosis not present

## 2016-05-18 DIAGNOSIS — S41111S Laceration without foreign body of right upper arm, sequela: Secondary | ICD-10-CM | POA: Diagnosis not present

## 2016-05-18 DIAGNOSIS — Z23 Encounter for immunization: Secondary | ICD-10-CM | POA: Diagnosis not present

## 2016-05-18 DIAGNOSIS — D62 Acute posthemorrhagic anemia: Secondary | ICD-10-CM | POA: Diagnosis not present

## 2016-07-06 DIAGNOSIS — L578 Other skin changes due to chronic exposure to nonionizing radiation: Secondary | ICD-10-CM | POA: Diagnosis not present

## 2016-07-06 DIAGNOSIS — T148XXA Other injury of unspecified body region, initial encounter: Secondary | ICD-10-CM | POA: Diagnosis not present

## 2016-08-09 DIAGNOSIS — I509 Heart failure, unspecified: Secondary | ICD-10-CM | POA: Diagnosis not present

## 2016-08-09 DIAGNOSIS — D508 Other iron deficiency anemias: Secondary | ICD-10-CM | POA: Diagnosis not present

## 2016-08-09 DIAGNOSIS — Z7901 Long term (current) use of anticoagulants: Secondary | ICD-10-CM | POA: Diagnosis not present

## 2016-08-09 DIAGNOSIS — I480 Paroxysmal atrial fibrillation: Secondary | ICD-10-CM | POA: Diagnosis not present

## 2016-08-09 DIAGNOSIS — I5032 Chronic diastolic (congestive) heart failure: Secondary | ICD-10-CM | POA: Diagnosis not present

## 2016-08-09 DIAGNOSIS — I110 Hypertensive heart disease with heart failure: Secondary | ICD-10-CM | POA: Diagnosis not present

## 2016-08-09 DIAGNOSIS — E784 Other hyperlipidemia: Secondary | ICD-10-CM | POA: Diagnosis not present

## 2017-01-10 DIAGNOSIS — E785 Hyperlipidemia, unspecified: Secondary | ICD-10-CM | POA: Diagnosis not present

## 2017-01-10 DIAGNOSIS — Z9181 History of falling: Secondary | ICD-10-CM | POA: Diagnosis not present

## 2017-01-10 DIAGNOSIS — Z79899 Other long term (current) drug therapy: Secondary | ICD-10-CM | POA: Diagnosis not present

## 2017-01-10 DIAGNOSIS — Z862 Personal history of diseases of the blood and blood-forming organs and certain disorders involving the immune mechanism: Secondary | ICD-10-CM | POA: Diagnosis not present

## 2017-01-10 DIAGNOSIS — I10 Essential (primary) hypertension: Secondary | ICD-10-CM | POA: Diagnosis not present

## 2017-03-14 DIAGNOSIS — I10 Essential (primary) hypertension: Secondary | ICD-10-CM | POA: Diagnosis not present

## 2017-03-14 DIAGNOSIS — I2510 Atherosclerotic heart disease of native coronary artery without angina pectoris: Secondary | ICD-10-CM | POA: Diagnosis not present

## 2017-03-14 DIAGNOSIS — I480 Paroxysmal atrial fibrillation: Secondary | ICD-10-CM | POA: Diagnosis not present

## 2017-03-14 DIAGNOSIS — E784 Other hyperlipidemia: Secondary | ICD-10-CM | POA: Diagnosis not present

## 2017-03-14 DIAGNOSIS — N183 Chronic kidney disease, stage 3 (moderate): Secondary | ICD-10-CM | POA: Diagnosis not present

## 2017-03-14 DIAGNOSIS — I5032 Chronic diastolic (congestive) heart failure: Secondary | ICD-10-CM | POA: Diagnosis not present

## 2017-03-14 DIAGNOSIS — I110 Hypertensive heart disease with heart failure: Secondary | ICD-10-CM | POA: Diagnosis not present

## 2017-03-14 DIAGNOSIS — D508 Other iron deficiency anemias: Secondary | ICD-10-CM | POA: Diagnosis not present

## 2017-03-14 DIAGNOSIS — I252 Old myocardial infarction: Secondary | ICD-10-CM | POA: Diagnosis not present

## 2017-03-14 DIAGNOSIS — I350 Nonrheumatic aortic (valve) stenosis: Secondary | ICD-10-CM | POA: Diagnosis not present

## 2017-03-27 DIAGNOSIS — I350 Nonrheumatic aortic (valve) stenosis: Secondary | ICD-10-CM | POA: Diagnosis not present

## 2017-04-12 ENCOUNTER — Other Ambulatory Visit: Payer: Self-pay | Admitting: Cardiology

## 2017-04-12 NOTE — Telephone Encounter (Signed)
Left message for pt to return call to see if she plans to continue cardiac care with Dr Bettina Gavia in [facility]

## 2017-04-13 ENCOUNTER — Other Ambulatory Visit: Payer: Self-pay | Admitting: Cardiology

## 2017-04-13 NOTE — Telephone Encounter (Signed)
Left message for pt to return call to see if pt is going to continue cardiac care in [facility] with Dr Bettina Gavia

## 2017-06-05 DIAGNOSIS — H5202 Hypermetropia, left eye: Secondary | ICD-10-CM | POA: Diagnosis not present

## 2017-06-05 DIAGNOSIS — H524 Presbyopia: Secondary | ICD-10-CM | POA: Diagnosis not present

## 2017-06-05 DIAGNOSIS — H52222 Regular astigmatism, left eye: Secondary | ICD-10-CM | POA: Diagnosis not present

## 2017-06-05 DIAGNOSIS — Z961 Presence of intraocular lens: Secondary | ICD-10-CM | POA: Diagnosis not present

## 2017-06-05 DIAGNOSIS — H43393 Other vitreous opacities, bilateral: Secondary | ICD-10-CM | POA: Diagnosis not present

## 2017-06-05 DIAGNOSIS — Z9841 Cataract extraction status, right eye: Secondary | ICD-10-CM | POA: Diagnosis not present

## 2017-06-05 DIAGNOSIS — I10 Essential (primary) hypertension: Secondary | ICD-10-CM | POA: Diagnosis not present

## 2017-06-05 DIAGNOSIS — Z9842 Cataract extraction status, left eye: Secondary | ICD-10-CM | POA: Diagnosis not present

## 2017-06-05 DIAGNOSIS — H5211 Myopia, right eye: Secondary | ICD-10-CM | POA: Diagnosis not present

## 2017-06-05 DIAGNOSIS — H16221 Keratoconjunctivitis sicca, not specified as Sjogren's, right eye: Secondary | ICD-10-CM | POA: Diagnosis not present

## 2017-06-05 DIAGNOSIS — H04121 Dry eye syndrome of right lacrimal gland: Secondary | ICD-10-CM | POA: Diagnosis not present

## 2017-06-05 DIAGNOSIS — H43813 Vitreous degeneration, bilateral: Secondary | ICD-10-CM | POA: Diagnosis not present

## 2017-06-06 DIAGNOSIS — Z23 Encounter for immunization: Secondary | ICD-10-CM | POA: Diagnosis not present

## 2017-07-11 DIAGNOSIS — E871 Hypo-osmolality and hyponatremia: Secondary | ICD-10-CM | POA: Diagnosis not present

## 2017-07-11 DIAGNOSIS — D649 Anemia, unspecified: Secondary | ICD-10-CM | POA: Diagnosis not present

## 2017-07-11 DIAGNOSIS — Z8639 Personal history of other endocrine, nutritional and metabolic disease: Secondary | ICD-10-CM | POA: Diagnosis not present

## 2017-07-13 DIAGNOSIS — I10 Essential (primary) hypertension: Secondary | ICD-10-CM | POA: Diagnosis not present

## 2017-07-13 DIAGNOSIS — R0609 Other forms of dyspnea: Secondary | ICD-10-CM | POA: Diagnosis not present

## 2017-07-13 DIAGNOSIS — J90 Pleural effusion, not elsewhere classified: Secondary | ICD-10-CM | POA: Diagnosis not present

## 2017-07-13 DIAGNOSIS — I493 Ventricular premature depolarization: Secondary | ICD-10-CM | POA: Diagnosis not present

## 2017-07-13 DIAGNOSIS — R002 Palpitations: Secondary | ICD-10-CM | POA: Diagnosis not present

## 2017-07-13 DIAGNOSIS — I480 Paroxysmal atrial fibrillation: Secondary | ICD-10-CM | POA: Diagnosis not present

## 2017-07-13 DIAGNOSIS — R300 Dysuria: Secondary | ICD-10-CM | POA: Diagnosis not present

## 2017-07-13 DIAGNOSIS — Z955 Presence of coronary angioplasty implant and graft: Secondary | ICD-10-CM | POA: Diagnosis not present

## 2017-07-13 DIAGNOSIS — Z7901 Long term (current) use of anticoagulants: Secondary | ICD-10-CM | POA: Diagnosis not present

## 2017-07-13 DIAGNOSIS — I2510 Atherosclerotic heart disease of native coronary artery without angina pectoris: Secondary | ICD-10-CM | POA: Diagnosis not present

## 2017-07-13 DIAGNOSIS — R0602 Shortness of breath: Secondary | ICD-10-CM | POA: Diagnosis not present

## 2017-07-13 DIAGNOSIS — I350 Nonrheumatic aortic (valve) stenosis: Secondary | ICD-10-CM | POA: Diagnosis not present

## 2017-07-13 DIAGNOSIS — I252 Old myocardial infarction: Secondary | ICD-10-CM | POA: Diagnosis not present

## 2017-07-20 DIAGNOSIS — Z6829 Body mass index (BMI) 29.0-29.9, adult: Secondary | ICD-10-CM | POA: Diagnosis not present

## 2017-07-20 DIAGNOSIS — Z0000 Encounter for general adult medical examination without abnormal findings: Secondary | ICD-10-CM | POA: Diagnosis not present

## 2017-07-20 DIAGNOSIS — Z1339 Encounter for screening examination for other mental health and behavioral disorders: Secondary | ICD-10-CM | POA: Diagnosis not present

## 2017-07-20 DIAGNOSIS — J189 Pneumonia, unspecified organism: Secondary | ICD-10-CM | POA: Diagnosis not present

## 2017-11-21 DIAGNOSIS — I493 Ventricular premature depolarization: Secondary | ICD-10-CM | POA: Diagnosis not present

## 2017-11-21 DIAGNOSIS — Z7901 Long term (current) use of anticoagulants: Secondary | ICD-10-CM | POA: Diagnosis not present

## 2017-11-21 DIAGNOSIS — I350 Nonrheumatic aortic (valve) stenosis: Secondary | ICD-10-CM | POA: Diagnosis not present

## 2017-11-21 DIAGNOSIS — I252 Old myocardial infarction: Secondary | ICD-10-CM | POA: Diagnosis not present

## 2017-11-21 DIAGNOSIS — Z955 Presence of coronary angioplasty implant and graft: Secondary | ICD-10-CM | POA: Diagnosis not present

## 2017-11-21 DIAGNOSIS — I480 Paroxysmal atrial fibrillation: Secondary | ICD-10-CM | POA: Diagnosis not present

## 2017-11-21 DIAGNOSIS — I2510 Atherosclerotic heart disease of native coronary artery without angina pectoris: Secondary | ICD-10-CM | POA: Diagnosis not present

## 2017-12-10 DIAGNOSIS — M1990 Unspecified osteoarthritis, unspecified site: Secondary | ICD-10-CM | POA: Diagnosis not present

## 2017-12-10 DIAGNOSIS — I10 Essential (primary) hypertension: Secondary | ICD-10-CM | POA: Diagnosis not present

## 2017-12-10 DIAGNOSIS — D649 Anemia, unspecified: Secondary | ICD-10-CM | POA: Diagnosis not present

## 2017-12-10 DIAGNOSIS — Z79899 Other long term (current) drug therapy: Secondary | ICD-10-CM | POA: Diagnosis not present

## 2017-12-10 DIAGNOSIS — E785 Hyperlipidemia, unspecified: Secondary | ICD-10-CM | POA: Diagnosis not present

## 2018-01-24 DIAGNOSIS — I350 Nonrheumatic aortic (valve) stenosis: Secondary | ICD-10-CM | POA: Diagnosis not present

## 2018-01-24 DIAGNOSIS — I480 Paroxysmal atrial fibrillation: Secondary | ICD-10-CM | POA: Diagnosis not present

## 2018-01-24 DIAGNOSIS — R0602 Shortness of breath: Secondary | ICD-10-CM | POA: Diagnosis not present

## 2018-01-25 DIAGNOSIS — I440 Atrioventricular block, first degree: Secondary | ICD-10-CM | POA: Diagnosis not present

## 2018-01-25 DIAGNOSIS — I499 Cardiac arrhythmia, unspecified: Secondary | ICD-10-CM | POA: Diagnosis not present

## 2018-01-25 DIAGNOSIS — R9431 Abnormal electrocardiogram [ECG] [EKG]: Secondary | ICD-10-CM | POA: Diagnosis not present

## 2018-01-25 DIAGNOSIS — I350 Nonrheumatic aortic (valve) stenosis: Secondary | ICD-10-CM | POA: Diagnosis not present

## 2018-01-25 DIAGNOSIS — I480 Paroxysmal atrial fibrillation: Secondary | ICD-10-CM | POA: Diagnosis not present

## 2018-02-11 DIAGNOSIS — I350 Nonrheumatic aortic (valve) stenosis: Secondary | ICD-10-CM | POA: Diagnosis not present

## 2018-03-27 DIAGNOSIS — I10 Essential (primary) hypertension: Secondary | ICD-10-CM | POA: Diagnosis not present

## 2018-03-27 DIAGNOSIS — I252 Old myocardial infarction: Secondary | ICD-10-CM | POA: Diagnosis not present

## 2018-03-27 DIAGNOSIS — Z7901 Long term (current) use of anticoagulants: Secondary | ICD-10-CM | POA: Diagnosis not present

## 2018-03-27 DIAGNOSIS — Z955 Presence of coronary angioplasty implant and graft: Secondary | ICD-10-CM | POA: Diagnosis not present

## 2018-03-27 DIAGNOSIS — I480 Paroxysmal atrial fibrillation: Secondary | ICD-10-CM | POA: Diagnosis not present

## 2018-03-27 DIAGNOSIS — I2510 Atherosclerotic heart disease of native coronary artery without angina pectoris: Secondary | ICD-10-CM | POA: Diagnosis not present

## 2018-03-27 DIAGNOSIS — I350 Nonrheumatic aortic (valve) stenosis: Secondary | ICD-10-CM | POA: Diagnosis not present

## 2018-04-02 DIAGNOSIS — I350 Nonrheumatic aortic (valve) stenosis: Secondary | ICD-10-CM | POA: Diagnosis not present

## 2018-04-02 DIAGNOSIS — R9431 Abnormal electrocardiogram [ECG] [EKG]: Secondary | ICD-10-CM | POA: Diagnosis not present

## 2018-04-02 DIAGNOSIS — I700 Atherosclerosis of aorta: Secondary | ICD-10-CM | POA: Diagnosis not present

## 2018-04-02 DIAGNOSIS — I440 Atrioventricular block, first degree: Secondary | ICD-10-CM | POA: Diagnosis not present

## 2018-04-02 DIAGNOSIS — Z01818 Encounter for other preprocedural examination: Secondary | ICD-10-CM | POA: Diagnosis not present

## 2018-04-02 DIAGNOSIS — K449 Diaphragmatic hernia without obstruction or gangrene: Secondary | ICD-10-CM | POA: Diagnosis not present

## 2018-04-02 DIAGNOSIS — J42 Unspecified chronic bronchitis: Secondary | ICD-10-CM | POA: Diagnosis not present

## 2018-04-02 DIAGNOSIS — I517 Cardiomegaly: Secondary | ICD-10-CM | POA: Diagnosis not present

## 2018-04-05 DIAGNOSIS — M40202 Unspecified kyphosis, cervical region: Secondary | ICD-10-CM | POA: Diagnosis not present

## 2018-04-05 DIAGNOSIS — I2510 Atherosclerotic heart disease of native coronary artery without angina pectoris: Secondary | ICD-10-CM | POA: Diagnosis not present

## 2018-04-05 DIAGNOSIS — I252 Old myocardial infarction: Secondary | ICD-10-CM | POA: Diagnosis not present

## 2018-04-05 DIAGNOSIS — Z955 Presence of coronary angioplasty implant and graft: Secondary | ICD-10-CM | POA: Diagnosis not present

## 2018-04-05 DIAGNOSIS — Z8673 Personal history of transient ischemic attack (TIA), and cerebral infarction without residual deficits: Secondary | ICD-10-CM | POA: Diagnosis not present

## 2018-04-05 DIAGNOSIS — Z7901 Long term (current) use of anticoagulants: Secondary | ICD-10-CM | POA: Diagnosis not present

## 2018-04-05 DIAGNOSIS — Z8781 Personal history of (healed) traumatic fracture: Secondary | ICD-10-CM | POA: Diagnosis not present

## 2018-04-05 DIAGNOSIS — I272 Pulmonary hypertension, unspecified: Secondary | ICD-10-CM | POA: Diagnosis not present

## 2018-04-05 DIAGNOSIS — I110 Hypertensive heart disease with heart failure: Secondary | ICD-10-CM | POA: Diagnosis not present

## 2018-04-05 DIAGNOSIS — I509 Heart failure, unspecified: Secondary | ICD-10-CM | POA: Diagnosis not present

## 2018-04-05 DIAGNOSIS — I480 Paroxysmal atrial fibrillation: Secondary | ICD-10-CM | POA: Diagnosis not present

## 2018-04-05 DIAGNOSIS — I350 Nonrheumatic aortic (valve) stenosis: Secondary | ICD-10-CM | POA: Diagnosis not present

## 2018-04-24 DIAGNOSIS — I350 Nonrheumatic aortic (valve) stenosis: Secondary | ICD-10-CM | POA: Diagnosis not present

## 2018-04-24 DIAGNOSIS — J984 Other disorders of lung: Secondary | ICD-10-CM | POA: Diagnosis not present

## 2018-04-24 DIAGNOSIS — E042 Nontoxic multinodular goiter: Secondary | ICD-10-CM | POA: Diagnosis not present

## 2018-05-08 DIAGNOSIS — Z23 Encounter for immunization: Secondary | ICD-10-CM | POA: Diagnosis not present

## 2018-05-08 DIAGNOSIS — Z6829 Body mass index (BMI) 29.0-29.9, adult: Secondary | ICD-10-CM | POA: Diagnosis not present

## 2018-05-08 DIAGNOSIS — M1990 Unspecified osteoarthritis, unspecified site: Secondary | ICD-10-CM | POA: Diagnosis not present

## 2018-05-08 DIAGNOSIS — Z9181 History of falling: Secondary | ICD-10-CM | POA: Diagnosis not present

## 2018-05-13 DIAGNOSIS — I517 Cardiomegaly: Secondary | ICD-10-CM | POA: Diagnosis not present

## 2018-05-13 DIAGNOSIS — I493 Ventricular premature depolarization: Secondary | ICD-10-CM | POA: Diagnosis not present

## 2018-05-13 DIAGNOSIS — I440 Atrioventricular block, first degree: Secondary | ICD-10-CM | POA: Diagnosis not present

## 2018-05-16 DIAGNOSIS — I480 Paroxysmal atrial fibrillation: Secondary | ICD-10-CM | POA: Diagnosis not present

## 2018-05-16 DIAGNOSIS — I350 Nonrheumatic aortic (valve) stenosis: Secondary | ICD-10-CM | POA: Diagnosis not present

## 2018-05-16 DIAGNOSIS — Z952 Presence of prosthetic heart valve: Secondary | ICD-10-CM | POA: Diagnosis not present

## 2018-05-16 DIAGNOSIS — E785 Hyperlipidemia, unspecified: Secondary | ICD-10-CM | POA: Diagnosis present

## 2018-05-16 DIAGNOSIS — D649 Anemia, unspecified: Secondary | ICD-10-CM | POA: Diagnosis present

## 2018-05-16 DIAGNOSIS — I10 Essential (primary) hypertension: Secondary | ICD-10-CM | POA: Diagnosis not present

## 2018-05-16 DIAGNOSIS — Z006 Encounter for examination for normal comparison and control in clinical research program: Secondary | ICD-10-CM | POA: Diagnosis not present

## 2018-05-16 DIAGNOSIS — I2510 Atherosclerotic heart disease of native coronary artery without angina pectoris: Secondary | ICD-10-CM | POA: Diagnosis present

## 2018-05-16 DIAGNOSIS — R9431 Abnormal electrocardiogram [ECG] [EKG]: Secondary | ICD-10-CM | POA: Diagnosis not present

## 2018-05-16 DIAGNOSIS — Z8673 Personal history of transient ischemic attack (TIA), and cerebral infarction without residual deficits: Secondary | ICD-10-CM | POA: Diagnosis not present

## 2018-05-16 DIAGNOSIS — Z7901 Long term (current) use of anticoagulants: Secondary | ICD-10-CM | POA: Diagnosis not present

## 2018-05-16 DIAGNOSIS — I4891 Unspecified atrial fibrillation: Secondary | ICD-10-CM | POA: Diagnosis not present

## 2018-05-16 DIAGNOSIS — Z955 Presence of coronary angioplasty implant and graft: Secondary | ICD-10-CM | POA: Diagnosis not present

## 2018-05-16 DIAGNOSIS — I361 Nonrheumatic tricuspid (valve) insufficiency: Secondary | ICD-10-CM | POA: Diagnosis not present

## 2018-05-16 DIAGNOSIS — I252 Old myocardial infarction: Secondary | ICD-10-CM | POA: Diagnosis not present

## 2018-05-16 DIAGNOSIS — I272 Pulmonary hypertension, unspecified: Secondary | ICD-10-CM | POA: Diagnosis not present

## 2018-05-16 DIAGNOSIS — M40202 Unspecified kyphosis, cervical region: Secondary | ICD-10-CM | POA: Diagnosis present

## 2018-05-16 DIAGNOSIS — Z9181 History of falling: Secondary | ICD-10-CM | POA: Diagnosis not present

## 2018-05-23 DIAGNOSIS — Z952 Presence of prosthetic heart valve: Secondary | ICD-10-CM | POA: Diagnosis not present

## 2018-05-23 DIAGNOSIS — Z6828 Body mass index (BMI) 28.0-28.9, adult: Secondary | ICD-10-CM | POA: Diagnosis not present

## 2018-06-05 DIAGNOSIS — Z952 Presence of prosthetic heart valve: Secondary | ICD-10-CM | POA: Diagnosis not present

## 2018-06-05 DIAGNOSIS — I350 Nonrheumatic aortic (valve) stenosis: Secondary | ICD-10-CM | POA: Diagnosis not present

## 2018-06-05 DIAGNOSIS — I480 Paroxysmal atrial fibrillation: Secondary | ICD-10-CM | POA: Diagnosis not present

## 2018-06-05 DIAGNOSIS — Z955 Presence of coronary angioplasty implant and graft: Secondary | ICD-10-CM | POA: Diagnosis not present

## 2018-06-05 DIAGNOSIS — I252 Old myocardial infarction: Secondary | ICD-10-CM | POA: Diagnosis not present

## 2018-06-05 DIAGNOSIS — I10 Essential (primary) hypertension: Secondary | ICD-10-CM | POA: Diagnosis not present

## 2018-06-05 DIAGNOSIS — I2510 Atherosclerotic heart disease of native coronary artery without angina pectoris: Secondary | ICD-10-CM | POA: Diagnosis not present

## 2018-06-05 DIAGNOSIS — Z7901 Long term (current) use of anticoagulants: Secondary | ICD-10-CM | POA: Diagnosis not present

## 2018-06-20 DIAGNOSIS — I081 Rheumatic disorders of both mitral and tricuspid valves: Secondary | ICD-10-CM | POA: Diagnosis not present

## 2018-06-20 DIAGNOSIS — Z952 Presence of prosthetic heart valve: Secondary | ICD-10-CM | POA: Diagnosis not present

## 2018-06-25 DIAGNOSIS — M79606 Pain in leg, unspecified: Secondary | ICD-10-CM | POA: Diagnosis not present

## 2018-06-25 DIAGNOSIS — Z6829 Body mass index (BMI) 29.0-29.9, adult: Secondary | ICD-10-CM | POA: Diagnosis not present

## 2018-06-26 DIAGNOSIS — I10 Essential (primary) hypertension: Secondary | ICD-10-CM | POA: Diagnosis not present

## 2018-06-26 DIAGNOSIS — Z7901 Long term (current) use of anticoagulants: Secondary | ICD-10-CM | POA: Diagnosis not present

## 2018-06-26 DIAGNOSIS — I350 Nonrheumatic aortic (valve) stenosis: Secondary | ICD-10-CM | POA: Diagnosis not present

## 2018-06-26 DIAGNOSIS — I252 Old myocardial infarction: Secondary | ICD-10-CM | POA: Diagnosis not present

## 2018-06-26 DIAGNOSIS — I2510 Atherosclerotic heart disease of native coronary artery without angina pectoris: Secondary | ICD-10-CM | POA: Diagnosis not present

## 2018-06-26 DIAGNOSIS — Z952 Presence of prosthetic heart valve: Secondary | ICD-10-CM | POA: Diagnosis not present

## 2018-06-26 DIAGNOSIS — I480 Paroxysmal atrial fibrillation: Secondary | ICD-10-CM | POA: Diagnosis not present

## 2018-08-09 DIAGNOSIS — Z6828 Body mass index (BMI) 28.0-28.9, adult: Secondary | ICD-10-CM | POA: Diagnosis not present

## 2018-08-09 DIAGNOSIS — N390 Urinary tract infection, site not specified: Secondary | ICD-10-CM | POA: Diagnosis not present

## 2018-08-13 DIAGNOSIS — N760 Acute vaginitis: Secondary | ICD-10-CM | POA: Diagnosis not present

## 2018-08-29 DIAGNOSIS — N760 Acute vaginitis: Secondary | ICD-10-CM | POA: Diagnosis not present

## 2018-08-29 DIAGNOSIS — Z0000 Encounter for general adult medical examination without abnormal findings: Secondary | ICD-10-CM | POA: Diagnosis not present

## 2018-08-29 DIAGNOSIS — Z6828 Body mass index (BMI) 28.0-28.9, adult: Secondary | ICD-10-CM | POA: Diagnosis not present

## 2018-09-16 DIAGNOSIS — I10 Essential (primary) hypertension: Secondary | ICD-10-CM | POA: Diagnosis not present

## 2018-09-16 DIAGNOSIS — Z6828 Body mass index (BMI) 28.0-28.9, adult: Secondary | ICD-10-CM | POA: Diagnosis not present

## 2018-09-16 DIAGNOSIS — E785 Hyperlipidemia, unspecified: Secondary | ICD-10-CM | POA: Diagnosis not present

## 2018-09-16 DIAGNOSIS — R300 Dysuria: Secondary | ICD-10-CM | POA: Diagnosis not present

## 2018-09-16 DIAGNOSIS — Z79899 Other long term (current) drug therapy: Secondary | ICD-10-CM | POA: Diagnosis not present

## 2018-10-14 DIAGNOSIS — M545 Low back pain: Secondary | ICD-10-CM | POA: Diagnosis not present

## 2018-10-14 DIAGNOSIS — M48062 Spinal stenosis, lumbar region with neurogenic claudication: Secondary | ICD-10-CM | POA: Diagnosis not present

## 2018-10-14 DIAGNOSIS — G8929 Other chronic pain: Secondary | ICD-10-CM | POA: Diagnosis not present

## 2018-10-14 DIAGNOSIS — M792 Neuralgia and neuritis, unspecified: Secondary | ICD-10-CM | POA: Diagnosis not present

## 2018-10-14 DIAGNOSIS — Z7901 Long term (current) use of anticoagulants: Secondary | ICD-10-CM | POA: Diagnosis not present

## 2018-10-22 DIAGNOSIS — R9431 Abnormal electrocardiogram [ECG] [EKG]: Secondary | ICD-10-CM | POA: Diagnosis not present

## 2018-10-22 DIAGNOSIS — Z743 Need for continuous supervision: Secondary | ICD-10-CM | POA: Diagnosis not present

## 2018-10-22 DIAGNOSIS — J4541 Moderate persistent asthma with (acute) exacerbation: Secondary | ICD-10-CM | POA: Diagnosis not present

## 2018-10-22 DIAGNOSIS — M818 Other osteoporosis without current pathological fracture: Secondary | ICD-10-CM | POA: Diagnosis not present

## 2018-10-22 DIAGNOSIS — I10 Essential (primary) hypertension: Secondary | ICD-10-CM | POA: Diagnosis present

## 2018-10-22 DIAGNOSIS — Z952 Presence of prosthetic heart valve: Secondary | ICD-10-CM | POA: Diagnosis not present

## 2018-10-22 DIAGNOSIS — I2510 Atherosclerotic heart disease of native coronary artery without angina pectoris: Secondary | ICD-10-CM | POA: Diagnosis present

## 2018-10-22 DIAGNOSIS — R918 Other nonspecific abnormal finding of lung field: Secondary | ICD-10-CM | POA: Diagnosis not present

## 2018-10-22 DIAGNOSIS — J9811 Atelectasis: Secondary | ICD-10-CM | POA: Diagnosis not present

## 2018-10-22 DIAGNOSIS — D649 Anemia, unspecified: Secondary | ICD-10-CM | POA: Diagnosis not present

## 2018-10-22 DIAGNOSIS — E785 Hyperlipidemia, unspecified: Secondary | ICD-10-CM | POA: Diagnosis not present

## 2018-10-22 DIAGNOSIS — K219 Gastro-esophageal reflux disease without esophagitis: Secondary | ICD-10-CM | POA: Diagnosis present

## 2018-10-22 DIAGNOSIS — M549 Dorsalgia, unspecified: Secondary | ICD-10-CM | POA: Diagnosis present

## 2018-10-22 DIAGNOSIS — I4811 Longstanding persistent atrial fibrillation: Secondary | ICD-10-CM | POA: Diagnosis not present

## 2018-10-22 DIAGNOSIS — E46 Unspecified protein-calorie malnutrition: Secondary | ICD-10-CM | POA: Diagnosis not present

## 2018-10-22 DIAGNOSIS — D500 Iron deficiency anemia secondary to blood loss (chronic): Secondary | ICD-10-CM | POA: Diagnosis not present

## 2018-10-22 DIAGNOSIS — Z8673 Personal history of transient ischemic attack (TIA), and cerebral infarction without residual deficits: Secondary | ICD-10-CM | POA: Diagnosis not present

## 2018-10-22 DIAGNOSIS — J9601 Acute respiratory failure with hypoxia: Secondary | ICD-10-CM | POA: Diagnosis not present

## 2018-10-22 DIAGNOSIS — G8929 Other chronic pain: Secondary | ICD-10-CM | POA: Diagnosis present

## 2018-10-22 DIAGNOSIS — K222 Esophageal obstruction: Secondary | ICD-10-CM | POA: Diagnosis not present

## 2018-10-22 DIAGNOSIS — R195 Other fecal abnormalities: Secondary | ICD-10-CM | POA: Diagnosis not present

## 2018-10-22 DIAGNOSIS — M1990 Unspecified osteoarthritis, unspecified site: Secondary | ICD-10-CM | POA: Diagnosis present

## 2018-10-22 DIAGNOSIS — R05 Cough: Secondary | ICD-10-CM | POA: Diagnosis not present

## 2018-10-22 DIAGNOSIS — Z7901 Long term (current) use of anticoagulants: Secondary | ICD-10-CM | POA: Diagnosis not present

## 2018-10-22 DIAGNOSIS — K921 Melena: Secondary | ICD-10-CM | POA: Diagnosis not present

## 2018-10-22 DIAGNOSIS — R52 Pain, unspecified: Secondary | ICD-10-CM | POA: Diagnosis not present

## 2018-10-22 DIAGNOSIS — J189 Pneumonia, unspecified organism: Secondary | ICD-10-CM | POA: Diagnosis not present

## 2018-10-22 DIAGNOSIS — F419 Anxiety disorder, unspecified: Secondary | ICD-10-CM | POA: Diagnosis not present

## 2018-10-22 DIAGNOSIS — I4891 Unspecified atrial fibrillation: Secondary | ICD-10-CM | POA: Diagnosis not present

## 2018-10-22 DIAGNOSIS — R0602 Shortness of breath: Secondary | ICD-10-CM | POA: Diagnosis not present

## 2018-10-22 DIAGNOSIS — J180 Bronchopneumonia, unspecified organism: Secondary | ICD-10-CM | POA: Diagnosis not present

## 2018-10-22 DIAGNOSIS — R531 Weakness: Secondary | ICD-10-CM | POA: Diagnosis not present

## 2018-10-22 DIAGNOSIS — I252 Old myocardial infarction: Secondary | ICD-10-CM | POA: Diagnosis not present

## 2018-10-22 DIAGNOSIS — K922 Gastrointestinal hemorrhage, unspecified: Secondary | ICD-10-CM | POA: Diagnosis not present

## 2018-10-22 DIAGNOSIS — R069 Unspecified abnormalities of breathing: Secondary | ICD-10-CM | POA: Diagnosis not present

## 2018-10-23 DIAGNOSIS — D500 Iron deficiency anemia secondary to blood loss (chronic): Secondary | ICD-10-CM

## 2018-10-23 DIAGNOSIS — I2510 Atherosclerotic heart disease of native coronary artery without angina pectoris: Secondary | ICD-10-CM

## 2018-10-28 DIAGNOSIS — E785 Hyperlipidemia, unspecified: Secondary | ICD-10-CM | POA: Diagnosis not present

## 2018-10-28 DIAGNOSIS — J189 Pneumonia, unspecified organism: Secondary | ICD-10-CM | POA: Diagnosis not present

## 2018-10-28 DIAGNOSIS — M818 Other osteoporosis without current pathological fracture: Secondary | ICD-10-CM | POA: Diagnosis not present

## 2018-10-28 DIAGNOSIS — I2510 Atherosclerotic heart disease of native coronary artery without angina pectoris: Secondary | ICD-10-CM | POA: Diagnosis not present

## 2018-10-28 DIAGNOSIS — K921 Melena: Secondary | ICD-10-CM | POA: Diagnosis not present

## 2018-10-28 DIAGNOSIS — I4811 Longstanding persistent atrial fibrillation: Secondary | ICD-10-CM | POA: Diagnosis not present

## 2018-10-28 DIAGNOSIS — K222 Esophageal obstruction: Secondary | ICD-10-CM | POA: Diagnosis not present

## 2018-10-28 DIAGNOSIS — R195 Other fecal abnormalities: Secondary | ICD-10-CM | POA: Diagnosis not present

## 2018-10-28 DIAGNOSIS — K219 Gastro-esophageal reflux disease without esophagitis: Secondary | ICD-10-CM | POA: Diagnosis not present

## 2018-10-28 DIAGNOSIS — Z743 Need for continuous supervision: Secondary | ICD-10-CM | POA: Diagnosis not present

## 2018-10-28 DIAGNOSIS — I10 Essential (primary) hypertension: Secondary | ICD-10-CM | POA: Diagnosis not present

## 2018-10-28 DIAGNOSIS — J9621 Acute and chronic respiratory failure with hypoxia: Secondary | ICD-10-CM | POA: Diagnosis not present

## 2018-10-28 DIAGNOSIS — F419 Anxiety disorder, unspecified: Secondary | ICD-10-CM | POA: Diagnosis not present

## 2018-10-28 DIAGNOSIS — D649 Anemia, unspecified: Secondary | ICD-10-CM | POA: Diagnosis not present

## 2018-10-28 DIAGNOSIS — E46 Unspecified protein-calorie malnutrition: Secondary | ICD-10-CM | POA: Diagnosis not present

## 2018-10-28 DIAGNOSIS — I4891 Unspecified atrial fibrillation: Secondary | ICD-10-CM | POA: Diagnosis not present

## 2018-10-28 DIAGNOSIS — D500 Iron deficiency anemia secondary to blood loss (chronic): Secondary | ICD-10-CM | POA: Diagnosis not present

## 2018-10-28 DIAGNOSIS — J9601 Acute respiratory failure with hypoxia: Secondary | ICD-10-CM | POA: Diagnosis not present

## 2018-10-28 DIAGNOSIS — R531 Weakness: Secondary | ICD-10-CM | POA: Diagnosis not present

## 2018-10-28 DIAGNOSIS — R262 Difficulty in walking, not elsewhere classified: Secondary | ICD-10-CM | POA: Diagnosis not present

## 2018-10-29 DIAGNOSIS — R262 Difficulty in walking, not elsewhere classified: Secondary | ICD-10-CM | POA: Diagnosis not present

## 2018-10-29 DIAGNOSIS — J9621 Acute and chronic respiratory failure with hypoxia: Secondary | ICD-10-CM | POA: Diagnosis not present

## 2018-10-29 DIAGNOSIS — J189 Pneumonia, unspecified organism: Secondary | ICD-10-CM | POA: Diagnosis not present

## 2018-10-29 DIAGNOSIS — D649 Anemia, unspecified: Secondary | ICD-10-CM | POA: Diagnosis not present

## 2018-11-13 ENCOUNTER — Other Ambulatory Visit: Payer: Self-pay | Admitting: *Deleted

## 2018-11-13 NOTE — Patient Outreach (Signed)
[name]) Care Management  11/13/2018  Sarah Reyes 1928-07-08 [id number]    Member screened for potential [facility] Care Management services. According to Patient [name], member discharged home on 11/12/2018 with home health services.  Per [facility] UM documentation in acuity, member lives with husband and has a very involved son.   Will sign off. No identifiable [facility] Care Management needs at this time.   Marthenia Rolling, MSN-Ed, RN,BSN [place] Acute Care Coordinator [phone]

## 2018-11-14 DIAGNOSIS — Z79891 Long term (current) use of opiate analgesic: Secondary | ICD-10-CM | POA: Diagnosis not present

## 2018-11-14 DIAGNOSIS — I252 Old myocardial infarction: Secondary | ICD-10-CM | POA: Diagnosis not present

## 2018-11-14 DIAGNOSIS — M818 Other osteoporosis without current pathological fracture: Secondary | ICD-10-CM | POA: Diagnosis not present

## 2018-11-14 DIAGNOSIS — Z8673 Personal history of transient ischemic attack (TIA), and cerebral infarction without residual deficits: Secondary | ICD-10-CM | POA: Diagnosis not present

## 2018-11-14 DIAGNOSIS — E46 Unspecified protein-calorie malnutrition: Secondary | ICD-10-CM | POA: Diagnosis not present

## 2018-11-14 DIAGNOSIS — Z9181 History of falling: Secondary | ICD-10-CM | POA: Diagnosis not present

## 2018-11-14 DIAGNOSIS — I4891 Unspecified atrial fibrillation: Secondary | ICD-10-CM | POA: Diagnosis not present

## 2018-11-14 DIAGNOSIS — J189 Pneumonia, unspecified organism: Secondary | ICD-10-CM | POA: Diagnosis not present

## 2018-11-14 DIAGNOSIS — Z7901 Long term (current) use of anticoagulants: Secondary | ICD-10-CM | POA: Diagnosis not present

## 2018-11-14 DIAGNOSIS — D631 Anemia in chronic kidney disease: Secondary | ICD-10-CM | POA: Diagnosis not present

## 2018-11-14 DIAGNOSIS — K219 Gastro-esophageal reflux disease without esophagitis: Secondary | ICD-10-CM | POA: Diagnosis not present

## 2018-11-14 DIAGNOSIS — J9601 Acute respiratory failure with hypoxia: Secondary | ICD-10-CM | POA: Diagnosis not present

## 2018-11-14 DIAGNOSIS — E785 Hyperlipidemia, unspecified: Secondary | ICD-10-CM | POA: Diagnosis not present

## 2018-11-14 DIAGNOSIS — N189 Chronic kidney disease, unspecified: Secondary | ICD-10-CM | POA: Diagnosis not present

## 2018-11-14 DIAGNOSIS — Z952 Presence of prosthetic heart valve: Secondary | ICD-10-CM | POA: Diagnosis not present

## 2018-11-14 DIAGNOSIS — Z96651 Presence of right artificial knee joint: Secondary | ICD-10-CM | POA: Diagnosis not present

## 2018-11-14 DIAGNOSIS — I129 Hypertensive chronic kidney disease with stage 1 through stage 4 chronic kidney disease, or unspecified chronic kidney disease: Secondary | ICD-10-CM | POA: Diagnosis not present

## 2018-11-15 DIAGNOSIS — M792 Neuralgia and neuritis, unspecified: Secondary | ICD-10-CM | POA: Diagnosis not present

## 2018-11-15 DIAGNOSIS — M48062 Spinal stenosis, lumbar region with neurogenic claudication: Secondary | ICD-10-CM | POA: Diagnosis not present

## 2018-11-15 DIAGNOSIS — G8929 Other chronic pain: Secondary | ICD-10-CM | POA: Diagnosis not present

## 2018-11-15 DIAGNOSIS — M545 Low back pain: Secondary | ICD-10-CM | POA: Diagnosis not present

## 2018-11-15 DIAGNOSIS — Z7901 Long term (current) use of anticoagulants: Secondary | ICD-10-CM | POA: Diagnosis not present

## 2018-11-16 DIAGNOSIS — D631 Anemia in chronic kidney disease: Secondary | ICD-10-CM | POA: Diagnosis not present

## 2018-11-16 DIAGNOSIS — M818 Other osteoporosis without current pathological fracture: Secondary | ICD-10-CM | POA: Diagnosis not present

## 2018-11-16 DIAGNOSIS — N189 Chronic kidney disease, unspecified: Secondary | ICD-10-CM | POA: Diagnosis not present

## 2018-11-16 DIAGNOSIS — I129 Hypertensive chronic kidney disease with stage 1 through stage 4 chronic kidney disease, or unspecified chronic kidney disease: Secondary | ICD-10-CM | POA: Diagnosis not present

## 2018-11-16 DIAGNOSIS — J9601 Acute respiratory failure with hypoxia: Secondary | ICD-10-CM | POA: Diagnosis not present

## 2018-11-16 DIAGNOSIS — J189 Pneumonia, unspecified organism: Secondary | ICD-10-CM | POA: Diagnosis not present

## 2018-11-18 DIAGNOSIS — J189 Pneumonia, unspecified organism: Secondary | ICD-10-CM | POA: Diagnosis not present

## 2018-11-18 DIAGNOSIS — I129 Hypertensive chronic kidney disease with stage 1 through stage 4 chronic kidney disease, or unspecified chronic kidney disease: Secondary | ICD-10-CM | POA: Diagnosis not present

## 2018-11-18 DIAGNOSIS — D631 Anemia in chronic kidney disease: Secondary | ICD-10-CM | POA: Diagnosis not present

## 2018-11-18 DIAGNOSIS — J9601 Acute respiratory failure with hypoxia: Secondary | ICD-10-CM | POA: Diagnosis not present

## 2018-11-18 DIAGNOSIS — N189 Chronic kidney disease, unspecified: Secondary | ICD-10-CM | POA: Diagnosis not present

## 2018-11-18 DIAGNOSIS — M818 Other osteoporosis without current pathological fracture: Secondary | ICD-10-CM | POA: Diagnosis not present

## 2018-11-19 DIAGNOSIS — J189 Pneumonia, unspecified organism: Secondary | ICD-10-CM | POA: Diagnosis not present

## 2018-11-19 DIAGNOSIS — Z6828 Body mass index (BMI) 28.0-28.9, adult: Secondary | ICD-10-CM | POA: Diagnosis not present

## 2018-11-19 DIAGNOSIS — Z09 Encounter for follow-up examination after completed treatment for conditions other than malignant neoplasm: Secondary | ICD-10-CM | POA: Diagnosis not present

## 2018-11-21 DIAGNOSIS — D631 Anemia in chronic kidney disease: Secondary | ICD-10-CM | POA: Diagnosis not present

## 2018-11-21 DIAGNOSIS — J9601 Acute respiratory failure with hypoxia: Secondary | ICD-10-CM | POA: Diagnosis not present

## 2018-11-21 DIAGNOSIS — M4805 Spinal stenosis, thoracolumbar region: Secondary | ICD-10-CM | POA: Diagnosis not present

## 2018-11-21 DIAGNOSIS — M545 Low back pain: Secondary | ICD-10-CM | POA: Diagnosis not present

## 2018-11-21 DIAGNOSIS — M48062 Spinal stenosis, lumbar region with neurogenic claudication: Secondary | ICD-10-CM | POA: Diagnosis not present

## 2018-11-21 DIAGNOSIS — M818 Other osteoporosis without current pathological fracture: Secondary | ICD-10-CM | POA: Diagnosis not present

## 2018-11-21 DIAGNOSIS — I129 Hypertensive chronic kidney disease with stage 1 through stage 4 chronic kidney disease, or unspecified chronic kidney disease: Secondary | ICD-10-CM | POA: Diagnosis not present

## 2018-11-21 DIAGNOSIS — J189 Pneumonia, unspecified organism: Secondary | ICD-10-CM | POA: Diagnosis not present

## 2018-11-21 DIAGNOSIS — M5126 Other intervertebral disc displacement, lumbar region: Secondary | ICD-10-CM | POA: Diagnosis not present

## 2018-11-21 DIAGNOSIS — N189 Chronic kidney disease, unspecified: Secondary | ICD-10-CM | POA: Diagnosis not present

## 2018-11-22 DIAGNOSIS — J189 Pneumonia, unspecified organism: Secondary | ICD-10-CM | POA: Diagnosis not present

## 2018-11-22 DIAGNOSIS — J9601 Acute respiratory failure with hypoxia: Secondary | ICD-10-CM | POA: Diagnosis not present

## 2018-11-22 DIAGNOSIS — M818 Other osteoporosis without current pathological fracture: Secondary | ICD-10-CM | POA: Diagnosis not present

## 2018-11-22 DIAGNOSIS — N189 Chronic kidney disease, unspecified: Secondary | ICD-10-CM | POA: Diagnosis not present

## 2018-11-22 DIAGNOSIS — D631 Anemia in chronic kidney disease: Secondary | ICD-10-CM | POA: Diagnosis not present

## 2018-11-22 DIAGNOSIS — I129 Hypertensive chronic kidney disease with stage 1 through stage 4 chronic kidney disease, or unspecified chronic kidney disease: Secondary | ICD-10-CM | POA: Diagnosis not present

## 2018-11-25 DIAGNOSIS — J9601 Acute respiratory failure with hypoxia: Secondary | ICD-10-CM | POA: Diagnosis not present

## 2018-11-25 DIAGNOSIS — I129 Hypertensive chronic kidney disease with stage 1 through stage 4 chronic kidney disease, or unspecified chronic kidney disease: Secondary | ICD-10-CM | POA: Diagnosis not present

## 2018-11-25 DIAGNOSIS — J189 Pneumonia, unspecified organism: Secondary | ICD-10-CM | POA: Diagnosis not present

## 2018-11-25 DIAGNOSIS — M818 Other osteoporosis without current pathological fracture: Secondary | ICD-10-CM | POA: Diagnosis not present

## 2018-11-25 DIAGNOSIS — N189 Chronic kidney disease, unspecified: Secondary | ICD-10-CM | POA: Diagnosis not present

## 2018-11-25 DIAGNOSIS — D631 Anemia in chronic kidney disease: Secondary | ICD-10-CM | POA: Diagnosis not present

## 2018-11-26 DIAGNOSIS — J189 Pneumonia, unspecified organism: Secondary | ICD-10-CM | POA: Diagnosis not present

## 2018-11-26 DIAGNOSIS — I129 Hypertensive chronic kidney disease with stage 1 through stage 4 chronic kidney disease, or unspecified chronic kidney disease: Secondary | ICD-10-CM | POA: Diagnosis not present

## 2018-11-26 DIAGNOSIS — N189 Chronic kidney disease, unspecified: Secondary | ICD-10-CM | POA: Diagnosis not present

## 2018-11-26 DIAGNOSIS — D631 Anemia in chronic kidney disease: Secondary | ICD-10-CM | POA: Diagnosis not present

## 2018-11-26 DIAGNOSIS — J9601 Acute respiratory failure with hypoxia: Secondary | ICD-10-CM | POA: Diagnosis not present

## 2018-11-26 DIAGNOSIS — M818 Other osteoporosis without current pathological fracture: Secondary | ICD-10-CM | POA: Diagnosis not present

## 2018-11-27 DIAGNOSIS — N189 Chronic kidney disease, unspecified: Secondary | ICD-10-CM | POA: Diagnosis not present

## 2018-11-27 DIAGNOSIS — I129 Hypertensive chronic kidney disease with stage 1 through stage 4 chronic kidney disease, or unspecified chronic kidney disease: Secondary | ICD-10-CM | POA: Diagnosis not present

## 2018-11-27 DIAGNOSIS — M818 Other osteoporosis without current pathological fracture: Secondary | ICD-10-CM | POA: Diagnosis not present

## 2018-11-27 DIAGNOSIS — J189 Pneumonia, unspecified organism: Secondary | ICD-10-CM | POA: Diagnosis not present

## 2018-11-27 DIAGNOSIS — J9601 Acute respiratory failure with hypoxia: Secondary | ICD-10-CM | POA: Diagnosis not present

## 2018-11-27 DIAGNOSIS — D631 Anemia in chronic kidney disease: Secondary | ICD-10-CM | POA: Diagnosis not present

## 2018-11-28 DIAGNOSIS — J9601 Acute respiratory failure with hypoxia: Secondary | ICD-10-CM | POA: Diagnosis not present

## 2018-11-28 DIAGNOSIS — D631 Anemia in chronic kidney disease: Secondary | ICD-10-CM | POA: Diagnosis not present

## 2018-11-28 DIAGNOSIS — N189 Chronic kidney disease, unspecified: Secondary | ICD-10-CM | POA: Diagnosis not present

## 2018-11-28 DIAGNOSIS — I129 Hypertensive chronic kidney disease with stage 1 through stage 4 chronic kidney disease, or unspecified chronic kidney disease: Secondary | ICD-10-CM | POA: Diagnosis not present

## 2018-11-28 DIAGNOSIS — J189 Pneumonia, unspecified organism: Secondary | ICD-10-CM | POA: Diagnosis not present

## 2018-11-28 DIAGNOSIS — M818 Other osteoporosis without current pathological fracture: Secondary | ICD-10-CM | POA: Diagnosis not present

## 2018-12-02 DIAGNOSIS — D631 Anemia in chronic kidney disease: Secondary | ICD-10-CM | POA: Diagnosis not present

## 2018-12-02 DIAGNOSIS — I129 Hypertensive chronic kidney disease with stage 1 through stage 4 chronic kidney disease, or unspecified chronic kidney disease: Secondary | ICD-10-CM | POA: Diagnosis not present

## 2018-12-02 DIAGNOSIS — N189 Chronic kidney disease, unspecified: Secondary | ICD-10-CM | POA: Diagnosis not present

## 2018-12-02 DIAGNOSIS — J189 Pneumonia, unspecified organism: Secondary | ICD-10-CM | POA: Diagnosis not present

## 2018-12-02 DIAGNOSIS — J9601 Acute respiratory failure with hypoxia: Secondary | ICD-10-CM | POA: Diagnosis not present

## 2018-12-02 DIAGNOSIS — M818 Other osteoporosis without current pathological fracture: Secondary | ICD-10-CM | POA: Diagnosis not present

## 2018-12-04 DIAGNOSIS — I129 Hypertensive chronic kidney disease with stage 1 through stage 4 chronic kidney disease, or unspecified chronic kidney disease: Secondary | ICD-10-CM | POA: Diagnosis not present

## 2018-12-04 DIAGNOSIS — J9601 Acute respiratory failure with hypoxia: Secondary | ICD-10-CM | POA: Diagnosis not present

## 2018-12-04 DIAGNOSIS — N189 Chronic kidney disease, unspecified: Secondary | ICD-10-CM | POA: Diagnosis not present

## 2018-12-04 DIAGNOSIS — M818 Other osteoporosis without current pathological fracture: Secondary | ICD-10-CM | POA: Diagnosis not present

## 2018-12-04 DIAGNOSIS — D631 Anemia in chronic kidney disease: Secondary | ICD-10-CM | POA: Diagnosis not present

## 2018-12-04 DIAGNOSIS — J189 Pneumonia, unspecified organism: Secondary | ICD-10-CM | POA: Diagnosis not present

## 2018-12-05 DIAGNOSIS — N189 Chronic kidney disease, unspecified: Secondary | ICD-10-CM | POA: Diagnosis not present

## 2018-12-05 DIAGNOSIS — D631 Anemia in chronic kidney disease: Secondary | ICD-10-CM | POA: Diagnosis not present

## 2018-12-05 DIAGNOSIS — I129 Hypertensive chronic kidney disease with stage 1 through stage 4 chronic kidney disease, or unspecified chronic kidney disease: Secondary | ICD-10-CM | POA: Diagnosis not present

## 2018-12-05 DIAGNOSIS — J189 Pneumonia, unspecified organism: Secondary | ICD-10-CM | POA: Diagnosis not present

## 2018-12-05 DIAGNOSIS — J9601 Acute respiratory failure with hypoxia: Secondary | ICD-10-CM | POA: Diagnosis not present

## 2018-12-05 DIAGNOSIS — M818 Other osteoporosis without current pathological fracture: Secondary | ICD-10-CM | POA: Diagnosis not present

## 2018-12-09 DIAGNOSIS — J9601 Acute respiratory failure with hypoxia: Secondary | ICD-10-CM | POA: Diagnosis not present

## 2018-12-09 DIAGNOSIS — J189 Pneumonia, unspecified organism: Secondary | ICD-10-CM | POA: Diagnosis not present

## 2018-12-09 DIAGNOSIS — I129 Hypertensive chronic kidney disease with stage 1 through stage 4 chronic kidney disease, or unspecified chronic kidney disease: Secondary | ICD-10-CM | POA: Diagnosis not present

## 2018-12-09 DIAGNOSIS — M818 Other osteoporosis without current pathological fracture: Secondary | ICD-10-CM | POA: Diagnosis not present

## 2018-12-09 DIAGNOSIS — N189 Chronic kidney disease, unspecified: Secondary | ICD-10-CM | POA: Diagnosis not present

## 2018-12-09 DIAGNOSIS — D631 Anemia in chronic kidney disease: Secondary | ICD-10-CM | POA: Diagnosis not present

## 2018-12-10 DIAGNOSIS — J189 Pneumonia, unspecified organism: Secondary | ICD-10-CM | POA: Diagnosis not present

## 2018-12-10 DIAGNOSIS — D631 Anemia in chronic kidney disease: Secondary | ICD-10-CM | POA: Diagnosis not present

## 2018-12-10 DIAGNOSIS — I129 Hypertensive chronic kidney disease with stage 1 through stage 4 chronic kidney disease, or unspecified chronic kidney disease: Secondary | ICD-10-CM | POA: Diagnosis not present

## 2018-12-10 DIAGNOSIS — J9601 Acute respiratory failure with hypoxia: Secondary | ICD-10-CM | POA: Diagnosis not present

## 2018-12-10 DIAGNOSIS — N189 Chronic kidney disease, unspecified: Secondary | ICD-10-CM | POA: Diagnosis not present

## 2018-12-10 DIAGNOSIS — M818 Other osteoporosis without current pathological fracture: Secondary | ICD-10-CM | POA: Diagnosis not present

## 2018-12-11 DIAGNOSIS — M792 Neuralgia and neuritis, unspecified: Secondary | ICD-10-CM | POA: Diagnosis not present

## 2018-12-11 DIAGNOSIS — M48062 Spinal stenosis, lumbar region with neurogenic claudication: Secondary | ICD-10-CM | POA: Diagnosis not present

## 2018-12-11 DIAGNOSIS — M545 Low back pain: Secondary | ICD-10-CM | POA: Diagnosis not present

## 2018-12-11 DIAGNOSIS — Z79899 Other long term (current) drug therapy: Secondary | ICD-10-CM | POA: Diagnosis not present

## 2018-12-11 DIAGNOSIS — G8929 Other chronic pain: Secondary | ICD-10-CM | POA: Diagnosis not present

## 2018-12-12 DIAGNOSIS — N189 Chronic kidney disease, unspecified: Secondary | ICD-10-CM | POA: Diagnosis not present

## 2018-12-12 DIAGNOSIS — D631 Anemia in chronic kidney disease: Secondary | ICD-10-CM | POA: Diagnosis not present

## 2018-12-12 DIAGNOSIS — I129 Hypertensive chronic kidney disease with stage 1 through stage 4 chronic kidney disease, or unspecified chronic kidney disease: Secondary | ICD-10-CM | POA: Diagnosis not present

## 2018-12-12 DIAGNOSIS — J9601 Acute respiratory failure with hypoxia: Secondary | ICD-10-CM | POA: Diagnosis not present

## 2018-12-12 DIAGNOSIS — J189 Pneumonia, unspecified organism: Secondary | ICD-10-CM | POA: Diagnosis not present

## 2018-12-12 DIAGNOSIS — M818 Other osteoporosis without current pathological fracture: Secondary | ICD-10-CM | POA: Diagnosis not present

## 2018-12-14 DIAGNOSIS — D631 Anemia in chronic kidney disease: Secondary | ICD-10-CM | POA: Diagnosis not present

## 2018-12-14 DIAGNOSIS — Z9181 History of falling: Secondary | ICD-10-CM | POA: Diagnosis not present

## 2018-12-14 DIAGNOSIS — J189 Pneumonia, unspecified organism: Secondary | ICD-10-CM | POA: Diagnosis not present

## 2018-12-14 DIAGNOSIS — J9601 Acute respiratory failure with hypoxia: Secondary | ICD-10-CM | POA: Diagnosis not present

## 2018-12-14 DIAGNOSIS — Z8673 Personal history of transient ischemic attack (TIA), and cerebral infarction without residual deficits: Secondary | ICD-10-CM | POA: Diagnosis not present

## 2018-12-14 DIAGNOSIS — Z7901 Long term (current) use of anticoagulants: Secondary | ICD-10-CM | POA: Diagnosis not present

## 2018-12-14 DIAGNOSIS — I129 Hypertensive chronic kidney disease with stage 1 through stage 4 chronic kidney disease, or unspecified chronic kidney disease: Secondary | ICD-10-CM | POA: Diagnosis not present

## 2018-12-14 DIAGNOSIS — E46 Unspecified protein-calorie malnutrition: Secondary | ICD-10-CM | POA: Diagnosis not present

## 2018-12-14 DIAGNOSIS — Z79891 Long term (current) use of opiate analgesic: Secondary | ICD-10-CM | POA: Diagnosis not present

## 2018-12-14 DIAGNOSIS — I252 Old myocardial infarction: Secondary | ICD-10-CM | POA: Diagnosis not present

## 2018-12-14 DIAGNOSIS — E785 Hyperlipidemia, unspecified: Secondary | ICD-10-CM | POA: Diagnosis not present

## 2018-12-14 DIAGNOSIS — Z952 Presence of prosthetic heart valve: Secondary | ICD-10-CM | POA: Diagnosis not present

## 2018-12-14 DIAGNOSIS — K219 Gastro-esophageal reflux disease without esophagitis: Secondary | ICD-10-CM | POA: Diagnosis not present

## 2018-12-14 DIAGNOSIS — Z96651 Presence of right artificial knee joint: Secondary | ICD-10-CM | POA: Diagnosis not present

## 2018-12-14 DIAGNOSIS — I4891 Unspecified atrial fibrillation: Secondary | ICD-10-CM | POA: Diagnosis not present

## 2018-12-14 DIAGNOSIS — M818 Other osteoporosis without current pathological fracture: Secondary | ICD-10-CM | POA: Diagnosis not present

## 2018-12-14 DIAGNOSIS — N189 Chronic kidney disease, unspecified: Secondary | ICD-10-CM | POA: Diagnosis not present

## 2018-12-16 DIAGNOSIS — D631 Anemia in chronic kidney disease: Secondary | ICD-10-CM | POA: Diagnosis not present

## 2018-12-16 DIAGNOSIS — N189 Chronic kidney disease, unspecified: Secondary | ICD-10-CM | POA: Diagnosis not present

## 2018-12-16 DIAGNOSIS — M818 Other osteoporosis without current pathological fracture: Secondary | ICD-10-CM | POA: Diagnosis not present

## 2018-12-16 DIAGNOSIS — J189 Pneumonia, unspecified organism: Secondary | ICD-10-CM | POA: Diagnosis not present

## 2018-12-16 DIAGNOSIS — J9601 Acute respiratory failure with hypoxia: Secondary | ICD-10-CM | POA: Diagnosis not present

## 2018-12-16 DIAGNOSIS — I129 Hypertensive chronic kidney disease with stage 1 through stage 4 chronic kidney disease, or unspecified chronic kidney disease: Secondary | ICD-10-CM | POA: Diagnosis not present

## 2018-12-17 DIAGNOSIS — I129 Hypertensive chronic kidney disease with stage 1 through stage 4 chronic kidney disease, or unspecified chronic kidney disease: Secondary | ICD-10-CM | POA: Diagnosis not present

## 2018-12-17 DIAGNOSIS — J9601 Acute respiratory failure with hypoxia: Secondary | ICD-10-CM | POA: Diagnosis not present

## 2018-12-17 DIAGNOSIS — M818 Other osteoporosis without current pathological fracture: Secondary | ICD-10-CM | POA: Diagnosis not present

## 2018-12-17 DIAGNOSIS — J189 Pneumonia, unspecified organism: Secondary | ICD-10-CM | POA: Diagnosis not present

## 2018-12-17 DIAGNOSIS — N189 Chronic kidney disease, unspecified: Secondary | ICD-10-CM | POA: Diagnosis not present

## 2018-12-17 DIAGNOSIS — D631 Anemia in chronic kidney disease: Secondary | ICD-10-CM | POA: Diagnosis not present

## 2018-12-18 DIAGNOSIS — N189 Chronic kidney disease, unspecified: Secondary | ICD-10-CM | POA: Diagnosis not present

## 2018-12-18 DIAGNOSIS — J9601 Acute respiratory failure with hypoxia: Secondary | ICD-10-CM | POA: Diagnosis not present

## 2018-12-18 DIAGNOSIS — M818 Other osteoporosis without current pathological fracture: Secondary | ICD-10-CM | POA: Diagnosis not present

## 2018-12-18 DIAGNOSIS — J189 Pneumonia, unspecified organism: Secondary | ICD-10-CM | POA: Diagnosis not present

## 2018-12-18 DIAGNOSIS — I129 Hypertensive chronic kidney disease with stage 1 through stage 4 chronic kidney disease, or unspecified chronic kidney disease: Secondary | ICD-10-CM | POA: Diagnosis not present

## 2018-12-18 DIAGNOSIS — D631 Anemia in chronic kidney disease: Secondary | ICD-10-CM | POA: Diagnosis not present

## 2018-12-19 DIAGNOSIS — J9601 Acute respiratory failure with hypoxia: Secondary | ICD-10-CM | POA: Diagnosis not present

## 2018-12-19 DIAGNOSIS — M818 Other osteoporosis without current pathological fracture: Secondary | ICD-10-CM | POA: Diagnosis not present

## 2018-12-19 DIAGNOSIS — I129 Hypertensive chronic kidney disease with stage 1 through stage 4 chronic kidney disease, or unspecified chronic kidney disease: Secondary | ICD-10-CM | POA: Diagnosis not present

## 2018-12-19 DIAGNOSIS — N189 Chronic kidney disease, unspecified: Secondary | ICD-10-CM | POA: Diagnosis not present

## 2018-12-19 DIAGNOSIS — J189 Pneumonia, unspecified organism: Secondary | ICD-10-CM | POA: Diagnosis not present

## 2018-12-19 DIAGNOSIS — D631 Anemia in chronic kidney disease: Secondary | ICD-10-CM | POA: Diagnosis not present

## 2018-12-31 DIAGNOSIS — D631 Anemia in chronic kidney disease: Secondary | ICD-10-CM | POA: Diagnosis not present

## 2018-12-31 DIAGNOSIS — N189 Chronic kidney disease, unspecified: Secondary | ICD-10-CM | POA: Diagnosis not present

## 2018-12-31 DIAGNOSIS — J9601 Acute respiratory failure with hypoxia: Secondary | ICD-10-CM | POA: Diagnosis not present

## 2018-12-31 DIAGNOSIS — J189 Pneumonia, unspecified organism: Secondary | ICD-10-CM | POA: Diagnosis not present

## 2018-12-31 DIAGNOSIS — I129 Hypertensive chronic kidney disease with stage 1 through stage 4 chronic kidney disease, or unspecified chronic kidney disease: Secondary | ICD-10-CM | POA: Diagnosis not present

## 2018-12-31 DIAGNOSIS — M818 Other osteoporosis without current pathological fracture: Secondary | ICD-10-CM | POA: Diagnosis not present

## 2019-01-15 DIAGNOSIS — M5417 Radiculopathy, lumbosacral region: Secondary | ICD-10-CM | POA: Diagnosis not present

## 2019-01-15 DIAGNOSIS — M5416 Radiculopathy, lumbar region: Secondary | ICD-10-CM | POA: Diagnosis not present

## 2019-02-05 DIAGNOSIS — M792 Neuralgia and neuritis, unspecified: Secondary | ICD-10-CM | POA: Diagnosis not present

## 2019-02-05 DIAGNOSIS — Z79899 Other long term (current) drug therapy: Secondary | ICD-10-CM | POA: Diagnosis not present

## 2019-02-05 DIAGNOSIS — M545 Low back pain: Secondary | ICD-10-CM | POA: Diagnosis not present

## 2019-02-05 DIAGNOSIS — M48062 Spinal stenosis, lumbar region with neurogenic claudication: Secondary | ICD-10-CM | POA: Diagnosis not present

## 2019-02-05 DIAGNOSIS — G8929 Other chronic pain: Secondary | ICD-10-CM | POA: Diagnosis not present

## 2019-02-17 DIAGNOSIS — Z79899 Other long term (current) drug therapy: Secondary | ICD-10-CM | POA: Diagnosis not present

## 2019-02-17 DIAGNOSIS — M4800 Spinal stenosis, site unspecified: Secondary | ICD-10-CM | POA: Diagnosis not present

## 2019-02-17 DIAGNOSIS — E785 Hyperlipidemia, unspecified: Secondary | ICD-10-CM | POA: Diagnosis not present

## 2019-02-17 DIAGNOSIS — I10 Essential (primary) hypertension: Secondary | ICD-10-CM | POA: Diagnosis not present

## 2019-02-17 DIAGNOSIS — Z6828 Body mass index (BMI) 28.0-28.9, adult: Secondary | ICD-10-CM | POA: Diagnosis not present

## 2019-02-25 DIAGNOSIS — M4185 Other forms of scoliosis, thoracolumbar region: Secondary | ICD-10-CM | POA: Diagnosis not present

## 2019-02-25 DIAGNOSIS — M792 Neuralgia and neuritis, unspecified: Secondary | ICD-10-CM | POA: Diagnosis not present

## 2019-02-25 DIAGNOSIS — M5136 Other intervertebral disc degeneration, lumbar region: Secondary | ICD-10-CM | POA: Diagnosis not present

## 2019-02-25 DIAGNOSIS — M48062 Spinal stenosis, lumbar region with neurogenic claudication: Secondary | ICD-10-CM | POA: Diagnosis not present

## 2019-02-25 DIAGNOSIS — Z7901 Long term (current) use of anticoagulants: Secondary | ICD-10-CM | POA: Diagnosis not present

## 2019-02-25 DIAGNOSIS — M5134 Other intervertebral disc degeneration, thoracic region: Secondary | ICD-10-CM | POA: Diagnosis not present

## 2019-02-25 DIAGNOSIS — M5126 Other intervertebral disc displacement, lumbar region: Secondary | ICD-10-CM | POA: Diagnosis not present

## 2019-02-25 DIAGNOSIS — M47816 Spondylosis without myelopathy or radiculopathy, lumbar region: Secondary | ICD-10-CM | POA: Diagnosis not present

## 2019-03-20 DIAGNOSIS — D649 Anemia, unspecified: Secondary | ICD-10-CM | POA: Diagnosis not present

## 2019-04-09 DIAGNOSIS — Z1159 Encounter for screening for other viral diseases: Secondary | ICD-10-CM | POA: Diagnosis not present

## 2019-04-16 DIAGNOSIS — M48062 Spinal stenosis, lumbar region with neurogenic claudication: Secondary | ICD-10-CM | POA: Diagnosis not present

## 2019-04-16 DIAGNOSIS — M48061 Spinal stenosis, lumbar region without neurogenic claudication: Secondary | ICD-10-CM | POA: Diagnosis not present

## 2019-04-25 DIAGNOSIS — Z79899 Other long term (current) drug therapy: Secondary | ICD-10-CM | POA: Diagnosis not present

## 2019-04-25 DIAGNOSIS — M792 Neuralgia and neuritis, unspecified: Secondary | ICD-10-CM | POA: Diagnosis not present

## 2019-04-25 DIAGNOSIS — M48061 Spinal stenosis, lumbar region without neurogenic claudication: Secondary | ICD-10-CM | POA: Diagnosis not present

## 2019-05-20 DIAGNOSIS — L97511 Non-pressure chronic ulcer of other part of right foot limited to breakdown of skin: Secondary | ICD-10-CM | POA: Diagnosis not present

## 2019-05-20 DIAGNOSIS — B351 Tinea unguium: Secondary | ICD-10-CM | POA: Diagnosis not present

## 2019-05-26 DIAGNOSIS — M792 Neuralgia and neuritis, unspecified: Secondary | ICD-10-CM | POA: Diagnosis not present

## 2019-05-26 DIAGNOSIS — M48062 Spinal stenosis, lumbar region with neurogenic claudication: Secondary | ICD-10-CM | POA: Diagnosis not present

## 2019-05-26 DIAGNOSIS — Z23 Encounter for immunization: Secondary | ICD-10-CM | POA: Diagnosis not present

## 2019-05-28 DIAGNOSIS — H5211 Myopia, right eye: Secondary | ICD-10-CM | POA: Diagnosis not present

## 2019-05-28 DIAGNOSIS — H52222 Regular astigmatism, left eye: Secondary | ICD-10-CM | POA: Diagnosis not present

## 2019-05-28 DIAGNOSIS — H524 Presbyopia: Secondary | ICD-10-CM | POA: Diagnosis not present

## 2019-05-28 DIAGNOSIS — H16223 Keratoconjunctivitis sicca, not specified as Sjogren's, bilateral: Secondary | ICD-10-CM | POA: Diagnosis not present

## 2019-05-28 DIAGNOSIS — H5202 Hypermetropia, left eye: Secondary | ICD-10-CM | POA: Diagnosis not present

## 2019-05-28 DIAGNOSIS — H04123 Dry eye syndrome of bilateral lacrimal glands: Secondary | ICD-10-CM | POA: Diagnosis not present

## 2019-05-28 DIAGNOSIS — Z961 Presence of intraocular lens: Secondary | ICD-10-CM | POA: Diagnosis not present

## 2019-05-28 DIAGNOSIS — Z9849 Cataract extraction status, unspecified eye: Secondary | ICD-10-CM | POA: Diagnosis not present

## 2019-06-03 DIAGNOSIS — L97511 Non-pressure chronic ulcer of other part of right foot limited to breakdown of skin: Secondary | ICD-10-CM | POA: Diagnosis not present

## 2019-06-06 DIAGNOSIS — E7800 Pure hypercholesterolemia, unspecified: Secondary | ICD-10-CM | POA: Diagnosis not present

## 2019-06-06 DIAGNOSIS — I10 Essential (primary) hypertension: Secondary | ICD-10-CM | POA: Diagnosis not present

## 2019-09-16 DIAGNOSIS — M48062 Spinal stenosis, lumbar region with neurogenic claudication: Secondary | ICD-10-CM | POA: Diagnosis not present

## 2019-10-21 DIAGNOSIS — Z0000 Encounter for general adult medical examination without abnormal findings: Secondary | ICD-10-CM | POA: Diagnosis not present

## 2019-10-21 DIAGNOSIS — E7800 Pure hypercholesterolemia, unspecified: Secondary | ICD-10-CM | POA: Diagnosis not present

## 2019-10-21 DIAGNOSIS — Z1331 Encounter for screening for depression: Secondary | ICD-10-CM | POA: Diagnosis not present

## 2019-10-21 DIAGNOSIS — I10 Essential (primary) hypertension: Secondary | ICD-10-CM | POA: Diagnosis not present

## 2019-10-21 DIAGNOSIS — Z6827 Body mass index (BMI) 27.0-27.9, adult: Secondary | ICD-10-CM | POA: Diagnosis not present

## 2019-10-21 DIAGNOSIS — Z79899 Other long term (current) drug therapy: Secondary | ICD-10-CM | POA: Diagnosis not present

## 2019-12-15 DIAGNOSIS — R300 Dysuria: Secondary | ICD-10-CM | POA: Diagnosis not present

## 2019-12-15 DIAGNOSIS — Z6826 Body mass index (BMI) 26.0-26.9, adult: Secondary | ICD-10-CM | POA: Diagnosis not present

## 2019-12-29 DIAGNOSIS — M545 Low back pain: Secondary | ICD-10-CM | POA: Diagnosis not present

## 2019-12-31 DIAGNOSIS — R0902 Hypoxemia: Secondary | ICD-10-CM | POA: Diagnosis not present

## 2019-12-31 DIAGNOSIS — S0001XA Abrasion of scalp, initial encounter: Secondary | ICD-10-CM | POA: Diagnosis not present

## 2019-12-31 DIAGNOSIS — S0990XA Unspecified injury of head, initial encounter: Secondary | ICD-10-CM | POA: Diagnosis not present

## 2019-12-31 DIAGNOSIS — W19XXXA Unspecified fall, initial encounter: Secondary | ICD-10-CM | POA: Diagnosis not present

## 2019-12-31 DIAGNOSIS — S3992XA Unspecified injury of lower back, initial encounter: Secondary | ICD-10-CM | POA: Diagnosis not present

## 2019-12-31 DIAGNOSIS — S199XXA Unspecified injury of neck, initial encounter: Secondary | ICD-10-CM | POA: Diagnosis not present

## 2019-12-31 DIAGNOSIS — S22080A Wedge compression fracture of T11-T12 vertebra, initial encounter for closed fracture: Secondary | ICD-10-CM | POA: Diagnosis not present

## 2020-01-08 DIAGNOSIS — Z209 Contact with and (suspected) exposure to unspecified communicable disease: Secondary | ICD-10-CM | POA: Diagnosis not present

## 2020-01-08 DIAGNOSIS — M5416 Radiculopathy, lumbar region: Secondary | ICD-10-CM | POA: Diagnosis not present

## 2020-01-09 DIAGNOSIS — Z209 Contact with and (suspected) exposure to unspecified communicable disease: Secondary | ICD-10-CM | POA: Diagnosis not present

## 2020-01-13 DIAGNOSIS — M5416 Radiculopathy, lumbar region: Secondary | ICD-10-CM | POA: Diagnosis not present

## 2020-01-13 DIAGNOSIS — M48061 Spinal stenosis, lumbar region without neurogenic claudication: Secondary | ICD-10-CM | POA: Diagnosis not present

## 2020-01-15 DIAGNOSIS — Z20828 Contact with and (suspected) exposure to other viral communicable diseases: Secondary | ICD-10-CM | POA: Diagnosis not present

## 2020-01-15 DIAGNOSIS — R54 Age-related physical debility: Secondary | ICD-10-CM | POA: Diagnosis not present

## 2020-01-15 DIAGNOSIS — Z20822 Contact with and (suspected) exposure to covid-19: Secondary | ICD-10-CM | POA: Diagnosis not present

## 2020-01-16 DIAGNOSIS — I4891 Unspecified atrial fibrillation: Secondary | ICD-10-CM | POA: Diagnosis not present

## 2020-01-16 DIAGNOSIS — D649 Anemia, unspecified: Secondary | ICD-10-CM | POA: Diagnosis not present

## 2020-01-16 DIAGNOSIS — Z6825 Body mass index (BMI) 25.0-25.9, adult: Secondary | ICD-10-CM | POA: Diagnosis not present

## 2020-01-16 DIAGNOSIS — M6281 Muscle weakness (generalized): Secondary | ICD-10-CM | POA: Diagnosis not present

## 2020-01-16 DIAGNOSIS — M545 Low back pain: Secondary | ICD-10-CM | POA: Diagnosis not present

## 2020-01-16 DIAGNOSIS — R269 Unspecified abnormalities of gait and mobility: Secondary | ICD-10-CM | POA: Diagnosis not present

## 2020-01-16 DIAGNOSIS — M1990 Unspecified osteoarthritis, unspecified site: Secondary | ICD-10-CM | POA: Diagnosis not present

## 2020-01-16 DIAGNOSIS — M4800 Spinal stenosis, site unspecified: Secondary | ICD-10-CM | POA: Diagnosis not present

## 2020-01-16 DIAGNOSIS — I10 Essential (primary) hypertension: Secondary | ICD-10-CM | POA: Diagnosis not present

## 2020-01-17 DIAGNOSIS — Z7982 Long term (current) use of aspirin: Secondary | ICD-10-CM | POA: Diagnosis not present

## 2020-01-17 DIAGNOSIS — M109 Gout, unspecified: Secondary | ICD-10-CM | POA: Diagnosis not present

## 2020-01-17 DIAGNOSIS — E7800 Pure hypercholesterolemia, unspecified: Secondary | ICD-10-CM | POA: Diagnosis not present

## 2020-01-17 DIAGNOSIS — M1990 Unspecified osteoarthritis, unspecified site: Secondary | ICD-10-CM | POA: Diagnosis not present

## 2020-01-17 DIAGNOSIS — Z7901 Long term (current) use of anticoagulants: Secondary | ICD-10-CM | POA: Diagnosis not present

## 2020-01-17 DIAGNOSIS — I10 Essential (primary) hypertension: Secondary | ICD-10-CM | POA: Diagnosis not present

## 2020-01-17 DIAGNOSIS — I4891 Unspecified atrial fibrillation: Secondary | ICD-10-CM | POA: Diagnosis not present

## 2020-01-17 DIAGNOSIS — D649 Anemia, unspecified: Secondary | ICD-10-CM | POA: Diagnosis not present

## 2020-01-17 DIAGNOSIS — G4700 Insomnia, unspecified: Secondary | ICD-10-CM | POA: Diagnosis not present

## 2020-01-17 DIAGNOSIS — M48061 Spinal stenosis, lumbar region without neurogenic claudication: Secondary | ICD-10-CM | POA: Diagnosis not present

## 2020-01-21 ENCOUNTER — Inpatient Hospital Stay
Admission: AD | Admit: 2020-01-21 | Payer: Medicare Other | Source: Other Acute Inpatient Hospital | Admitting: Internal Medicine

## 2020-01-21 DIAGNOSIS — R519 Headache, unspecified: Secondary | ICD-10-CM | POA: Diagnosis not present

## 2020-01-21 DIAGNOSIS — E871 Hypo-osmolality and hyponatremia: Secondary | ICD-10-CM | POA: Diagnosis not present

## 2020-01-21 DIAGNOSIS — K449 Diaphragmatic hernia without obstruction or gangrene: Secondary | ICD-10-CM | POA: Diagnosis not present

## 2020-01-22 DIAGNOSIS — E871 Hypo-osmolality and hyponatremia: Secondary | ICD-10-CM | POA: Diagnosis not present

## 2020-01-23 DIAGNOSIS — E871 Hypo-osmolality and hyponatremia: Secondary | ICD-10-CM | POA: Diagnosis not present

## 2020-01-24 DIAGNOSIS — E871 Hypo-osmolality and hyponatremia: Secondary | ICD-10-CM | POA: Diagnosis not present

## 2020-01-25 DIAGNOSIS — E871 Hypo-osmolality and hyponatremia: Secondary | ICD-10-CM | POA: Diagnosis not present

## 2020-01-26 DIAGNOSIS — E871 Hypo-osmolality and hyponatremia: Secondary | ICD-10-CM | POA: Diagnosis not present

## 2020-01-27 DIAGNOSIS — E871 Hypo-osmolality and hyponatremia: Secondary | ICD-10-CM | POA: Diagnosis not present

## 2020-02-04 ENCOUNTER — Other Ambulatory Visit: Payer: Self-pay | Admitting: *Deleted

## 2020-02-04 NOTE — Patient Outreach (Signed)
Screened for potential [facility] Care Management needs as a benefit of  [employer] Medicare.  Sarah Reyes is currently receiving skilled therapy at [facility].   Writer attended telephonic interdisciplinary team meeting to assess for disposition needs and transition plan for resident.   Facility reports member is from ALF. Member's goal is to return to ALF where her husband resides.   Will continue to follow while member resides in SNF.    Marthenia Rolling, MSN-Ed, RN,BSN [place] Acute Care Coordinator [phone] [facility]) [phone]  (Toll free office)

## 2020-02-05 DIAGNOSIS — I4811 Longstanding persistent atrial fibrillation: Secondary | ICD-10-CM | POA: Diagnosis not present

## 2020-02-05 DIAGNOSIS — E7800 Pure hypercholesterolemia, unspecified: Secondary | ICD-10-CM | POA: Diagnosis not present

## 2020-02-05 DIAGNOSIS — M109 Gout, unspecified: Secondary | ICD-10-CM | POA: Diagnosis not present

## 2020-02-05 DIAGNOSIS — G4700 Insomnia, unspecified: Secondary | ICD-10-CM | POA: Diagnosis not present

## 2020-02-05 DIAGNOSIS — R262 Difficulty in walking, not elsewhere classified: Secondary | ICD-10-CM | POA: Diagnosis not present

## 2020-02-05 DIAGNOSIS — M48061 Spinal stenosis, lumbar region without neurogenic claudication: Secondary | ICD-10-CM | POA: Diagnosis not present

## 2020-02-05 DIAGNOSIS — I4891 Unspecified atrial fibrillation: Secondary | ICD-10-CM | POA: Diagnosis not present

## 2020-02-05 DIAGNOSIS — D649 Anemia, unspecified: Secondary | ICD-10-CM | POA: Diagnosis not present

## 2020-02-05 DIAGNOSIS — Z7982 Long term (current) use of aspirin: Secondary | ICD-10-CM | POA: Diagnosis not present

## 2020-02-05 DIAGNOSIS — Z7901 Long term (current) use of anticoagulants: Secondary | ICD-10-CM | POA: Diagnosis not present

## 2020-02-05 DIAGNOSIS — M546 Pain in thoracic spine: Secondary | ICD-10-CM | POA: Diagnosis not present

## 2020-02-05 DIAGNOSIS — D500 Iron deficiency anemia secondary to blood loss (chronic): Secondary | ICD-10-CM | POA: Diagnosis not present

## 2020-02-05 DIAGNOSIS — E871 Hypo-osmolality and hyponatremia: Secondary | ICD-10-CM | POA: Diagnosis not present

## 2020-02-05 DIAGNOSIS — M1990 Unspecified osteoarthritis, unspecified site: Secondary | ICD-10-CM | POA: Diagnosis not present

## 2020-02-05 DIAGNOSIS — E785 Hyperlipidemia, unspecified: Secondary | ICD-10-CM | POA: Diagnosis not present

## 2020-02-05 DIAGNOSIS — J392 Other diseases of pharynx: Secondary | ICD-10-CM | POA: Diagnosis not present

## 2020-02-05 DIAGNOSIS — K922 Gastrointestinal hemorrhage, unspecified: Secondary | ICD-10-CM | POA: Diagnosis not present

## 2020-02-05 DIAGNOSIS — J988 Other specified respiratory disorders: Secondary | ICD-10-CM | POA: Diagnosis not present

## 2020-02-05 DIAGNOSIS — K219 Gastro-esophageal reflux disease without esophagitis: Secondary | ICD-10-CM | POA: Diagnosis not present

## 2020-02-05 DIAGNOSIS — I10 Essential (primary) hypertension: Secondary | ICD-10-CM | POA: Diagnosis not present

## 2020-02-05 DIAGNOSIS — S22080D Wedge compression fracture of T11-T12 vertebra, subsequent encounter for fracture with routine healing: Secondary | ICD-10-CM | POA: Diagnosis not present

## 2020-02-05 DIAGNOSIS — K222 Esophageal obstruction: Secondary | ICD-10-CM | POA: Diagnosis not present

## 2020-02-05 DIAGNOSIS — E222 Syndrome of inappropriate secretion of antidiuretic hormone: Secondary | ICD-10-CM | POA: Diagnosis not present

## 2020-02-11 ENCOUNTER — Other Ambulatory Visit: Payer: Self-pay | Admitting: *Deleted

## 2020-02-11 NOTE — Patient Outreach (Signed)
Screened for potential [facility] Care Management needs as a benefit of  [employer] Medicare.  Sarah Reyes is receiving skilled therapy at [facility].  Writer attended telephonic interdisciplinary team meeting to assess for disposition needs and transition plan for resident.   Facility reports transition plan is for ALF with spouse. However, member will need to require less or minimal assist with toileting and hygiene for ALF to accept.  Will continue to follow.   Sarah Rolling, MSN-Ed, RN,BSN [place] Acute Care Coordinator [phone] [facility]) [phone]  (Toll free office)

## 2020-02-16 DIAGNOSIS — I10 Essential (primary) hypertension: Secondary | ICD-10-CM | POA: Diagnosis not present

## 2020-02-16 DIAGNOSIS — M48061 Spinal stenosis, lumbar region without neurogenic claudication: Secondary | ICD-10-CM | POA: Diagnosis not present

## 2020-02-16 DIAGNOSIS — M109 Gout, unspecified: Secondary | ICD-10-CM | POA: Diagnosis not present

## 2020-02-16 DIAGNOSIS — E7800 Pure hypercholesterolemia, unspecified: Secondary | ICD-10-CM | POA: Diagnosis not present

## 2020-02-16 DIAGNOSIS — I4891 Unspecified atrial fibrillation: Secondary | ICD-10-CM | POA: Diagnosis not present

## 2020-02-16 DIAGNOSIS — M1990 Unspecified osteoarthritis, unspecified site: Secondary | ICD-10-CM | POA: Diagnosis not present

## 2020-02-18 ENCOUNTER — Other Ambulatory Visit: Payer: Self-pay | Admitting: *Deleted

## 2020-02-18 DIAGNOSIS — E7800 Pure hypercholesterolemia, unspecified: Secondary | ICD-10-CM | POA: Diagnosis not present

## 2020-02-18 DIAGNOSIS — M6281 Muscle weakness (generalized): Secondary | ICD-10-CM | POA: Diagnosis not present

## 2020-02-18 DIAGNOSIS — I10 Essential (primary) hypertension: Secondary | ICD-10-CM | POA: Diagnosis not present

## 2020-02-18 DIAGNOSIS — K222 Esophageal obstruction: Secondary | ICD-10-CM | POA: Diagnosis not present

## 2020-02-18 DIAGNOSIS — R54 Age-related physical debility: Secondary | ICD-10-CM | POA: Diagnosis not present

## 2020-02-18 DIAGNOSIS — M48061 Spinal stenosis, lumbar region without neurogenic claudication: Secondary | ICD-10-CM | POA: Diagnosis not present

## 2020-02-18 DIAGNOSIS — E876 Hypokalemia: Secondary | ICD-10-CM | POA: Diagnosis not present

## 2020-02-18 DIAGNOSIS — M109 Gout, unspecified: Secondary | ICD-10-CM | POA: Diagnosis not present

## 2020-02-18 DIAGNOSIS — M1990 Unspecified osteoarthritis, unspecified site: Secondary | ICD-10-CM | POA: Diagnosis not present

## 2020-02-18 DIAGNOSIS — R269 Unspecified abnormalities of gait and mobility: Secondary | ICD-10-CM | POA: Diagnosis not present

## 2020-02-18 DIAGNOSIS — I4891 Unspecified atrial fibrillation: Secondary | ICD-10-CM | POA: Diagnosis not present

## 2020-02-18 NOTE — Patient Outreach (Signed)
[facility] Post- Acute Care Coordinator follow up.  Sarah Reyes transitioned to ALF on 02/17/20.  No identifiable [facility] Care Management needs at this time.    Marthenia Rolling, MSN-Ed, RN,BSN [place] Acute Care Coordinator [phone] [facility]) [phone]  (Toll free office)

## 2020-02-19 DIAGNOSIS — M1990 Unspecified osteoarthritis, unspecified site: Secondary | ICD-10-CM | POA: Diagnosis not present

## 2020-02-19 DIAGNOSIS — I4891 Unspecified atrial fibrillation: Secondary | ICD-10-CM | POA: Diagnosis not present

## 2020-02-19 DIAGNOSIS — M109 Gout, unspecified: Secondary | ICD-10-CM | POA: Diagnosis not present

## 2020-02-19 DIAGNOSIS — E7800 Pure hypercholesterolemia, unspecified: Secondary | ICD-10-CM | POA: Diagnosis not present

## 2020-02-19 DIAGNOSIS — I10 Essential (primary) hypertension: Secondary | ICD-10-CM | POA: Diagnosis not present

## 2020-02-19 DIAGNOSIS — M48061 Spinal stenosis, lumbar region without neurogenic claudication: Secondary | ICD-10-CM | POA: Diagnosis not present

## 2020-02-20 DIAGNOSIS — W19XXXA Unspecified fall, initial encounter: Secondary | ICD-10-CM | POA: Diagnosis not present

## 2020-02-20 DIAGNOSIS — R600 Localized edema: Secondary | ICD-10-CM | POA: Diagnosis not present

## 2020-02-20 DIAGNOSIS — S60222A Contusion of left hand, initial encounter: Secondary | ICD-10-CM | POA: Diagnosis not present

## 2020-02-20 DIAGNOSIS — S51812A Laceration without foreign body of left forearm, initial encounter: Secondary | ICD-10-CM | POA: Diagnosis not present

## 2020-02-20 DIAGNOSIS — R54 Age-related physical debility: Secondary | ICD-10-CM | POA: Diagnosis not present

## 2020-02-20 DIAGNOSIS — M6281 Muscle weakness (generalized): Secondary | ICD-10-CM | POA: Diagnosis not present

## 2020-02-20 DIAGNOSIS — S5012XA Contusion of left forearm, initial encounter: Secondary | ICD-10-CM | POA: Diagnosis not present

## 2020-02-20 DIAGNOSIS — R269 Unspecified abnormalities of gait and mobility: Secondary | ICD-10-CM | POA: Diagnosis not present

## 2020-02-23 DIAGNOSIS — I10 Essential (primary) hypertension: Secondary | ICD-10-CM | POA: Diagnosis not present

## 2020-02-23 DIAGNOSIS — D649 Anemia, unspecified: Secondary | ICD-10-CM | POA: Diagnosis not present

## 2020-02-24 DIAGNOSIS — I4891 Unspecified atrial fibrillation: Secondary | ICD-10-CM | POA: Diagnosis not present

## 2020-02-24 DIAGNOSIS — M1990 Unspecified osteoarthritis, unspecified site: Secondary | ICD-10-CM | POA: Diagnosis not present

## 2020-02-24 DIAGNOSIS — M109 Gout, unspecified: Secondary | ICD-10-CM | POA: Diagnosis not present

## 2020-02-24 DIAGNOSIS — E7800 Pure hypercholesterolemia, unspecified: Secondary | ICD-10-CM | POA: Diagnosis not present

## 2020-02-24 DIAGNOSIS — M48061 Spinal stenosis, lumbar region without neurogenic claudication: Secondary | ICD-10-CM | POA: Diagnosis not present

## 2020-02-24 DIAGNOSIS — I10 Essential (primary) hypertension: Secondary | ICD-10-CM | POA: Diagnosis not present

## 2020-02-25 DIAGNOSIS — R54 Age-related physical debility: Secondary | ICD-10-CM | POA: Diagnosis not present

## 2020-02-25 DIAGNOSIS — L03113 Cellulitis of right upper limb: Secondary | ICD-10-CM | POA: Diagnosis not present

## 2020-02-25 DIAGNOSIS — R600 Localized edema: Secondary | ICD-10-CM | POA: Diagnosis not present

## 2020-02-26 DIAGNOSIS — I10 Essential (primary) hypertension: Secondary | ICD-10-CM | POA: Diagnosis not present

## 2020-02-26 DIAGNOSIS — E7800 Pure hypercholesterolemia, unspecified: Secondary | ICD-10-CM | POA: Diagnosis not present

## 2020-02-26 DIAGNOSIS — R54 Age-related physical debility: Secondary | ICD-10-CM | POA: Diagnosis not present

## 2020-02-26 DIAGNOSIS — M1990 Unspecified osteoarthritis, unspecified site: Secondary | ICD-10-CM | POA: Diagnosis not present

## 2020-02-26 DIAGNOSIS — M48061 Spinal stenosis, lumbar region without neurogenic claudication: Secondary | ICD-10-CM | POA: Diagnosis not present

## 2020-02-26 DIAGNOSIS — M109 Gout, unspecified: Secondary | ICD-10-CM | POA: Diagnosis not present

## 2020-02-26 DIAGNOSIS — I4891 Unspecified atrial fibrillation: Secondary | ICD-10-CM | POA: Diagnosis not present

## 2020-02-26 DIAGNOSIS — R110 Nausea: Secondary | ICD-10-CM | POA: Diagnosis not present

## 2020-02-27 DIAGNOSIS — N390 Urinary tract infection, site not specified: Secondary | ICD-10-CM | POA: Diagnosis not present

## 2020-02-27 DIAGNOSIS — M48061 Spinal stenosis, lumbar region without neurogenic claudication: Secondary | ICD-10-CM | POA: Diagnosis not present

## 2020-02-27 DIAGNOSIS — M109 Gout, unspecified: Secondary | ICD-10-CM | POA: Diagnosis not present

## 2020-02-27 DIAGNOSIS — M1990 Unspecified osteoarthritis, unspecified site: Secondary | ICD-10-CM | POA: Diagnosis not present

## 2020-02-27 DIAGNOSIS — I4891 Unspecified atrial fibrillation: Secondary | ICD-10-CM | POA: Diagnosis not present

## 2020-02-27 DIAGNOSIS — I10 Essential (primary) hypertension: Secondary | ICD-10-CM | POA: Diagnosis not present

## 2020-02-27 DIAGNOSIS — E7800 Pure hypercholesterolemia, unspecified: Secondary | ICD-10-CM | POA: Diagnosis not present

## 2020-02-28 DIAGNOSIS — I10 Essential (primary) hypertension: Secondary | ICD-10-CM | POA: Diagnosis not present

## 2020-02-28 DIAGNOSIS — M48061 Spinal stenosis, lumbar region without neurogenic claudication: Secondary | ICD-10-CM | POA: Diagnosis not present

## 2020-02-28 DIAGNOSIS — E7800 Pure hypercholesterolemia, unspecified: Secondary | ICD-10-CM | POA: Diagnosis not present

## 2020-02-28 DIAGNOSIS — M109 Gout, unspecified: Secondary | ICD-10-CM | POA: Diagnosis not present

## 2020-02-28 DIAGNOSIS — I4891 Unspecified atrial fibrillation: Secondary | ICD-10-CM | POA: Diagnosis not present

## 2020-02-28 DIAGNOSIS — M1990 Unspecified osteoarthritis, unspecified site: Secondary | ICD-10-CM | POA: Diagnosis not present

## 2020-02-29 DIAGNOSIS — I10 Essential (primary) hypertension: Secondary | ICD-10-CM | POA: Diagnosis not present

## 2020-02-29 DIAGNOSIS — I4891 Unspecified atrial fibrillation: Secondary | ICD-10-CM | POA: Diagnosis not present

## 2020-02-29 DIAGNOSIS — M109 Gout, unspecified: Secondary | ICD-10-CM | POA: Diagnosis not present

## 2020-02-29 DIAGNOSIS — M1990 Unspecified osteoarthritis, unspecified site: Secondary | ICD-10-CM | POA: Diagnosis not present

## 2020-02-29 DIAGNOSIS — E7800 Pure hypercholesterolemia, unspecified: Secondary | ICD-10-CM | POA: Diagnosis not present

## 2020-02-29 DIAGNOSIS — M48061 Spinal stenosis, lumbar region without neurogenic claudication: Secondary | ICD-10-CM | POA: Diagnosis not present

## 2020-03-01 DIAGNOSIS — M109 Gout, unspecified: Secondary | ICD-10-CM | POA: Diagnosis not present

## 2020-03-01 DIAGNOSIS — M48061 Spinal stenosis, lumbar region without neurogenic claudication: Secondary | ICD-10-CM | POA: Diagnosis not present

## 2020-03-01 DIAGNOSIS — M1990 Unspecified osteoarthritis, unspecified site: Secondary | ICD-10-CM | POA: Diagnosis not present

## 2020-03-01 DIAGNOSIS — I4891 Unspecified atrial fibrillation: Secondary | ICD-10-CM | POA: Diagnosis not present

## 2020-03-01 DIAGNOSIS — E7800 Pure hypercholesterolemia, unspecified: Secondary | ICD-10-CM | POA: Diagnosis not present

## 2020-03-01 DIAGNOSIS — I10 Essential (primary) hypertension: Secondary | ICD-10-CM | POA: Diagnosis not present

## 2020-03-02 DIAGNOSIS — M1990 Unspecified osteoarthritis, unspecified site: Secondary | ICD-10-CM | POA: Diagnosis not present

## 2020-03-02 DIAGNOSIS — I4891 Unspecified atrial fibrillation: Secondary | ICD-10-CM | POA: Diagnosis not present

## 2020-03-02 DIAGNOSIS — I10 Essential (primary) hypertension: Secondary | ICD-10-CM | POA: Diagnosis not present

## 2020-03-02 DIAGNOSIS — M109 Gout, unspecified: Secondary | ICD-10-CM | POA: Diagnosis not present

## 2020-03-02 DIAGNOSIS — M48061 Spinal stenosis, lumbar region without neurogenic claudication: Secondary | ICD-10-CM | POA: Diagnosis not present

## 2020-03-02 DIAGNOSIS — E7800 Pure hypercholesterolemia, unspecified: Secondary | ICD-10-CM | POA: Diagnosis not present

## 2020-03-03 DIAGNOSIS — M6281 Muscle weakness (generalized): Secondary | ICD-10-CM | POA: Diagnosis not present

## 2020-03-03 DIAGNOSIS — S00531A Contusion of lip, initial encounter: Secondary | ICD-10-CM | POA: Diagnosis not present

## 2020-03-03 DIAGNOSIS — W19XXXA Unspecified fall, initial encounter: Secondary | ICD-10-CM | POA: Diagnosis not present

## 2020-03-03 DIAGNOSIS — S60552A Superficial foreign body of left hand, initial encounter: Secondary | ICD-10-CM | POA: Diagnosis not present

## 2020-03-03 DIAGNOSIS — R2689 Other abnormalities of gait and mobility: Secondary | ICD-10-CM | POA: Diagnosis not present

## 2020-03-03 DIAGNOSIS — R269 Unspecified abnormalities of gait and mobility: Secondary | ICD-10-CM | POA: Diagnosis not present

## 2020-03-03 DIAGNOSIS — R54 Age-related physical debility: Secondary | ICD-10-CM | POA: Diagnosis not present

## 2020-03-04 DIAGNOSIS — M1990 Unspecified osteoarthritis, unspecified site: Secondary | ICD-10-CM | POA: Diagnosis not present

## 2020-03-04 DIAGNOSIS — Z20828 Contact with and (suspected) exposure to other viral communicable diseases: Secondary | ICD-10-CM | POA: Diagnosis not present

## 2020-03-04 DIAGNOSIS — M109 Gout, unspecified: Secondary | ICD-10-CM | POA: Diagnosis not present

## 2020-03-04 DIAGNOSIS — I4891 Unspecified atrial fibrillation: Secondary | ICD-10-CM | POA: Diagnosis not present

## 2020-03-04 DIAGNOSIS — M48061 Spinal stenosis, lumbar region without neurogenic claudication: Secondary | ICD-10-CM | POA: Diagnosis not present

## 2020-03-04 DIAGNOSIS — I10 Essential (primary) hypertension: Secondary | ICD-10-CM | POA: Diagnosis not present

## 2020-03-04 DIAGNOSIS — E7800 Pure hypercholesterolemia, unspecified: Secondary | ICD-10-CM | POA: Diagnosis not present

## 2020-03-08 DIAGNOSIS — E7800 Pure hypercholesterolemia, unspecified: Secondary | ICD-10-CM | POA: Diagnosis not present

## 2020-03-08 DIAGNOSIS — M109 Gout, unspecified: Secondary | ICD-10-CM | POA: Diagnosis not present

## 2020-03-08 DIAGNOSIS — I10 Essential (primary) hypertension: Secondary | ICD-10-CM | POA: Diagnosis not present

## 2020-03-08 DIAGNOSIS — I4891 Unspecified atrial fibrillation: Secondary | ICD-10-CM | POA: Diagnosis not present

## 2020-03-08 DIAGNOSIS — M1990 Unspecified osteoarthritis, unspecified site: Secondary | ICD-10-CM | POA: Diagnosis not present

## 2020-03-08 DIAGNOSIS — M48061 Spinal stenosis, lumbar region without neurogenic claudication: Secondary | ICD-10-CM | POA: Diagnosis not present

## 2020-03-09 DIAGNOSIS — I10 Essential (primary) hypertension: Secondary | ICD-10-CM | POA: Diagnosis not present

## 2020-03-09 DIAGNOSIS — I480 Paroxysmal atrial fibrillation: Secondary | ICD-10-CM | POA: Diagnosis not present

## 2020-03-09 DIAGNOSIS — Z952 Presence of prosthetic heart valve: Secondary | ICD-10-CM | POA: Diagnosis not present

## 2020-03-09 DIAGNOSIS — R0602 Shortness of breath: Secondary | ICD-10-CM | POA: Diagnosis not present

## 2020-03-09 DIAGNOSIS — Z7901 Long term (current) use of anticoagulants: Secondary | ICD-10-CM | POA: Diagnosis not present

## 2020-03-09 DIAGNOSIS — I2510 Atherosclerotic heart disease of native coronary artery without angina pectoris: Secondary | ICD-10-CM | POA: Diagnosis not present

## 2020-03-09 DIAGNOSIS — I493 Ventricular premature depolarization: Secondary | ICD-10-CM | POA: Diagnosis not present

## 2020-03-09 DIAGNOSIS — I252 Old myocardial infarction: Secondary | ICD-10-CM | POA: Diagnosis not present

## 2020-03-09 DIAGNOSIS — I350 Nonrheumatic aortic (valve) stenosis: Secondary | ICD-10-CM | POA: Diagnosis not present

## 2020-03-09 DIAGNOSIS — Z955 Presence of coronary angioplasty implant and graft: Secondary | ICD-10-CM | POA: Diagnosis not present

## 2020-03-10 DIAGNOSIS — I4891 Unspecified atrial fibrillation: Secondary | ICD-10-CM | POA: Diagnosis not present

## 2020-03-11 DIAGNOSIS — Z20828 Contact with and (suspected) exposure to other viral communicable diseases: Secondary | ICD-10-CM | POA: Diagnosis not present

## 2020-03-11 DIAGNOSIS — I509 Heart failure, unspecified: Secondary | ICD-10-CM | POA: Diagnosis not present

## 2020-03-11 DIAGNOSIS — Z1331 Encounter for screening for depression: Secondary | ICD-10-CM | POA: Diagnosis not present

## 2020-03-11 DIAGNOSIS — I10 Essential (primary) hypertension: Secondary | ICD-10-CM | POA: Diagnosis not present

## 2020-03-11 DIAGNOSIS — M109 Gout, unspecified: Secondary | ICD-10-CM | POA: Diagnosis not present

## 2020-03-11 DIAGNOSIS — I25119 Atherosclerotic heart disease of native coronary artery with unspecified angina pectoris: Secondary | ICD-10-CM | POA: Diagnosis not present

## 2020-03-11 DIAGNOSIS — F320 Major depressive disorder, single episode, mild: Secondary | ICD-10-CM | POA: Diagnosis not present

## 2020-03-11 DIAGNOSIS — E785 Hyperlipidemia, unspecified: Secondary | ICD-10-CM | POA: Diagnosis not present

## 2020-03-11 DIAGNOSIS — M1990 Unspecified osteoarthritis, unspecified site: Secondary | ICD-10-CM | POA: Diagnosis not present

## 2020-03-11 DIAGNOSIS — E871 Hypo-osmolality and hyponatremia: Secondary | ICD-10-CM | POA: Diagnosis not present

## 2020-03-11 DIAGNOSIS — M48061 Spinal stenosis, lumbar region without neurogenic claudication: Secondary | ICD-10-CM | POA: Diagnosis not present

## 2020-03-11 DIAGNOSIS — M810 Age-related osteoporosis without current pathological fracture: Secondary | ICD-10-CM | POA: Diagnosis not present

## 2020-03-11 DIAGNOSIS — Z79899 Other long term (current) drug therapy: Secondary | ICD-10-CM | POA: Diagnosis not present

## 2020-03-11 DIAGNOSIS — I4891 Unspecified atrial fibrillation: Secondary | ICD-10-CM | POA: Diagnosis not present

## 2020-03-11 DIAGNOSIS — I480 Paroxysmal atrial fibrillation: Secondary | ICD-10-CM | POA: Diagnosis not present

## 2020-03-11 DIAGNOSIS — M48062 Spinal stenosis, lumbar region with neurogenic claudication: Secondary | ICD-10-CM | POA: Diagnosis not present

## 2020-03-11 DIAGNOSIS — E7800 Pure hypercholesterolemia, unspecified: Secondary | ICD-10-CM | POA: Diagnosis not present

## 2020-03-11 DIAGNOSIS — D509 Iron deficiency anemia, unspecified: Secondary | ICD-10-CM | POA: Diagnosis not present

## 2020-03-12 DIAGNOSIS — M109 Gout, unspecified: Secondary | ICD-10-CM | POA: Diagnosis not present

## 2020-03-12 DIAGNOSIS — D509 Iron deficiency anemia, unspecified: Secondary | ICD-10-CM | POA: Diagnosis not present

## 2020-03-12 DIAGNOSIS — I10 Essential (primary) hypertension: Secondary | ICD-10-CM | POA: Diagnosis not present

## 2020-03-12 DIAGNOSIS — I4891 Unspecified atrial fibrillation: Secondary | ICD-10-CM | POA: Diagnosis not present

## 2020-03-12 DIAGNOSIS — E7800 Pure hypercholesterolemia, unspecified: Secondary | ICD-10-CM | POA: Diagnosis not present

## 2020-03-12 DIAGNOSIS — M1990 Unspecified osteoarthritis, unspecified site: Secondary | ICD-10-CM | POA: Diagnosis not present

## 2020-03-12 DIAGNOSIS — E785 Hyperlipidemia, unspecified: Secondary | ICD-10-CM | POA: Diagnosis not present

## 2020-03-12 DIAGNOSIS — E871 Hypo-osmolality and hyponatremia: Secondary | ICD-10-CM | POA: Diagnosis not present

## 2020-03-12 DIAGNOSIS — M48061 Spinal stenosis, lumbar region without neurogenic claudication: Secondary | ICD-10-CM | POA: Diagnosis not present

## 2020-03-15 DIAGNOSIS — M48061 Spinal stenosis, lumbar region without neurogenic claudication: Secondary | ICD-10-CM | POA: Diagnosis not present

## 2020-03-15 DIAGNOSIS — I4891 Unspecified atrial fibrillation: Secondary | ICD-10-CM | POA: Diagnosis not present

## 2020-03-15 DIAGNOSIS — M1990 Unspecified osteoarthritis, unspecified site: Secondary | ICD-10-CM | POA: Diagnosis not present

## 2020-03-15 DIAGNOSIS — E7800 Pure hypercholesterolemia, unspecified: Secondary | ICD-10-CM | POA: Diagnosis not present

## 2020-03-15 DIAGNOSIS — I10 Essential (primary) hypertension: Secondary | ICD-10-CM | POA: Diagnosis not present

## 2020-03-15 DIAGNOSIS — M109 Gout, unspecified: Secondary | ICD-10-CM | POA: Diagnosis not present

## 2020-03-16 DIAGNOSIS — M48061 Spinal stenosis, lumbar region without neurogenic claudication: Secondary | ICD-10-CM | POA: Diagnosis not present

## 2020-03-16 DIAGNOSIS — E7800 Pure hypercholesterolemia, unspecified: Secondary | ICD-10-CM | POA: Diagnosis not present

## 2020-03-16 DIAGNOSIS — M1990 Unspecified osteoarthritis, unspecified site: Secondary | ICD-10-CM | POA: Diagnosis not present

## 2020-03-16 DIAGNOSIS — I10 Essential (primary) hypertension: Secondary | ICD-10-CM | POA: Diagnosis not present

## 2020-03-16 DIAGNOSIS — I4891 Unspecified atrial fibrillation: Secondary | ICD-10-CM | POA: Diagnosis not present

## 2020-03-16 DIAGNOSIS — M109 Gout, unspecified: Secondary | ICD-10-CM | POA: Diagnosis not present

## 2020-03-17 DIAGNOSIS — E871 Hypo-osmolality and hyponatremia: Secondary | ICD-10-CM | POA: Diagnosis not present

## 2020-03-17 DIAGNOSIS — M8008XD Age-related osteoporosis with current pathological fracture, vertebra(e), subsequent encounter for fracture with routine healing: Secondary | ICD-10-CM | POA: Diagnosis not present

## 2020-03-17 DIAGNOSIS — Z7982 Long term (current) use of aspirin: Secondary | ICD-10-CM | POA: Diagnosis not present

## 2020-03-17 DIAGNOSIS — M48061 Spinal stenosis, lumbar region without neurogenic claudication: Secondary | ICD-10-CM | POA: Diagnosis not present

## 2020-03-17 DIAGNOSIS — G4700 Insomnia, unspecified: Secondary | ICD-10-CM | POA: Diagnosis not present

## 2020-03-17 DIAGNOSIS — S51812D Laceration without foreign body of left forearm, subsequent encounter: Secondary | ICD-10-CM | POA: Diagnosis not present

## 2020-03-17 DIAGNOSIS — F419 Anxiety disorder, unspecified: Secondary | ICD-10-CM | POA: Diagnosis not present

## 2020-03-17 DIAGNOSIS — Z7901 Long term (current) use of anticoagulants: Secondary | ICD-10-CM | POA: Diagnosis not present

## 2020-03-17 DIAGNOSIS — M1990 Unspecified osteoarthritis, unspecified site: Secondary | ICD-10-CM | POA: Diagnosis not present

## 2020-03-17 DIAGNOSIS — M109 Gout, unspecified: Secondary | ICD-10-CM | POA: Diagnosis not present

## 2020-03-17 DIAGNOSIS — K222 Esophageal obstruction: Secondary | ICD-10-CM | POA: Diagnosis not present

## 2020-03-17 DIAGNOSIS — G8929 Other chronic pain: Secondary | ICD-10-CM | POA: Diagnosis not present

## 2020-03-17 DIAGNOSIS — E46 Unspecified protein-calorie malnutrition: Secondary | ICD-10-CM | POA: Diagnosis not present

## 2020-03-17 DIAGNOSIS — Z9181 History of falling: Secondary | ICD-10-CM | POA: Diagnosis not present

## 2020-03-17 DIAGNOSIS — S91312A Laceration without foreign body, left foot, initial encounter: Secondary | ICD-10-CM | POA: Diagnosis not present

## 2020-03-17 DIAGNOSIS — E7800 Pure hypercholesterolemia, unspecified: Secondary | ICD-10-CM | POA: Diagnosis not present

## 2020-03-17 DIAGNOSIS — Z8673 Personal history of transient ischemic attack (TIA), and cerebral infarction without residual deficits: Secondary | ICD-10-CM | POA: Diagnosis not present

## 2020-03-17 DIAGNOSIS — I4891 Unspecified atrial fibrillation: Secondary | ICD-10-CM | POA: Diagnosis not present

## 2020-03-17 DIAGNOSIS — D62 Acute posthemorrhagic anemia: Secondary | ICD-10-CM | POA: Diagnosis not present

## 2020-03-17 DIAGNOSIS — Z96651 Presence of right artificial knee joint: Secondary | ICD-10-CM | POA: Diagnosis not present

## 2020-03-17 DIAGNOSIS — I2510 Atherosclerotic heart disease of native coronary artery without angina pectoris: Secondary | ICD-10-CM | POA: Diagnosis not present

## 2020-03-17 DIAGNOSIS — K219 Gastro-esophageal reflux disease without esophagitis: Secondary | ICD-10-CM | POA: Diagnosis not present

## 2020-03-17 DIAGNOSIS — I10 Essential (primary) hypertension: Secondary | ICD-10-CM | POA: Diagnosis not present

## 2020-03-17 DIAGNOSIS — I252 Old myocardial infarction: Secondary | ICD-10-CM | POA: Diagnosis not present

## 2020-03-17 DIAGNOSIS — K5900 Constipation, unspecified: Secondary | ICD-10-CM | POA: Diagnosis not present

## 2020-03-18 DIAGNOSIS — E871 Hypo-osmolality and hyponatremia: Secondary | ICD-10-CM | POA: Diagnosis not present

## 2020-03-22 DIAGNOSIS — G8929 Other chronic pain: Secondary | ICD-10-CM | POA: Diagnosis not present

## 2020-03-22 DIAGNOSIS — S91312A Laceration without foreign body, left foot, initial encounter: Secondary | ICD-10-CM | POA: Diagnosis not present

## 2020-03-22 DIAGNOSIS — M8008XD Age-related osteoporosis with current pathological fracture, vertebra(e), subsequent encounter for fracture with routine healing: Secondary | ICD-10-CM | POA: Diagnosis not present

## 2020-03-22 DIAGNOSIS — D62 Acute posthemorrhagic anemia: Secondary | ICD-10-CM | POA: Diagnosis not present

## 2020-03-22 DIAGNOSIS — M48061 Spinal stenosis, lumbar region without neurogenic claudication: Secondary | ICD-10-CM | POA: Diagnosis not present

## 2020-03-22 DIAGNOSIS — E871 Hypo-osmolality and hyponatremia: Secondary | ICD-10-CM | POA: Diagnosis not present

## 2020-03-24 DIAGNOSIS — Z20828 Contact with and (suspected) exposure to other viral communicable diseases: Secondary | ICD-10-CM | POA: Diagnosis not present

## 2020-03-25 DIAGNOSIS — S91312A Laceration without foreign body, left foot, initial encounter: Secondary | ICD-10-CM | POA: Diagnosis not present

## 2020-03-25 DIAGNOSIS — E871 Hypo-osmolality and hyponatremia: Secondary | ICD-10-CM | POA: Diagnosis not present

## 2020-03-25 DIAGNOSIS — M48061 Spinal stenosis, lumbar region without neurogenic claudication: Secondary | ICD-10-CM | POA: Diagnosis not present

## 2020-03-25 DIAGNOSIS — M8008XD Age-related osteoporosis with current pathological fracture, vertebra(e), subsequent encounter for fracture with routine healing: Secondary | ICD-10-CM | POA: Diagnosis not present

## 2020-03-25 DIAGNOSIS — D62 Acute posthemorrhagic anemia: Secondary | ICD-10-CM | POA: Diagnosis not present

## 2020-03-25 DIAGNOSIS — G8929 Other chronic pain: Secondary | ICD-10-CM | POA: Diagnosis not present

## 2020-03-29 DIAGNOSIS — M48061 Spinal stenosis, lumbar region without neurogenic claudication: Secondary | ICD-10-CM | POA: Diagnosis not present

## 2020-03-29 DIAGNOSIS — G8929 Other chronic pain: Secondary | ICD-10-CM | POA: Diagnosis not present

## 2020-03-29 DIAGNOSIS — M8008XD Age-related osteoporosis with current pathological fracture, vertebra(e), subsequent encounter for fracture with routine healing: Secondary | ICD-10-CM | POA: Diagnosis not present

## 2020-03-29 DIAGNOSIS — S91312A Laceration without foreign body, left foot, initial encounter: Secondary | ICD-10-CM | POA: Diagnosis not present

## 2020-03-29 DIAGNOSIS — D62 Acute posthemorrhagic anemia: Secondary | ICD-10-CM | POA: Diagnosis not present

## 2020-03-29 DIAGNOSIS — E871 Hypo-osmolality and hyponatremia: Secondary | ICD-10-CM | POA: Diagnosis not present

## 2020-03-31 DIAGNOSIS — Z20828 Contact with and (suspected) exposure to other viral communicable diseases: Secondary | ICD-10-CM | POA: Diagnosis not present

## 2020-04-02 DIAGNOSIS — R634 Abnormal weight loss: Secondary | ICD-10-CM | POA: Diagnosis not present

## 2020-04-02 DIAGNOSIS — M8008XD Age-related osteoporosis with current pathological fracture, vertebra(e), subsequent encounter for fracture with routine healing: Secondary | ICD-10-CM | POA: Diagnosis not present

## 2020-04-02 DIAGNOSIS — D62 Acute posthemorrhagic anemia: Secondary | ICD-10-CM | POA: Diagnosis not present

## 2020-04-02 DIAGNOSIS — M48061 Spinal stenosis, lumbar region without neurogenic claudication: Secondary | ICD-10-CM | POA: Diagnosis not present

## 2020-04-02 DIAGNOSIS — M6281 Muscle weakness (generalized): Secondary | ICD-10-CM | POA: Diagnosis not present

## 2020-04-02 DIAGNOSIS — G8929 Other chronic pain: Secondary | ICD-10-CM | POA: Diagnosis not present

## 2020-04-02 DIAGNOSIS — S91312A Laceration without foreign body, left foot, initial encounter: Secondary | ICD-10-CM | POA: Diagnosis not present

## 2020-04-02 DIAGNOSIS — E871 Hypo-osmolality and hyponatremia: Secondary | ICD-10-CM | POA: Diagnosis not present

## 2020-04-02 DIAGNOSIS — R630 Anorexia: Secondary | ICD-10-CM | POA: Diagnosis not present

## 2020-04-02 DIAGNOSIS — R54 Age-related physical debility: Secondary | ICD-10-CM | POA: Diagnosis not present

## 2020-04-05 DIAGNOSIS — M8008XD Age-related osteoporosis with current pathological fracture, vertebra(e), subsequent encounter for fracture with routine healing: Secondary | ICD-10-CM | POA: Diagnosis not present

## 2020-04-05 DIAGNOSIS — E871 Hypo-osmolality and hyponatremia: Secondary | ICD-10-CM | POA: Diagnosis not present

## 2020-04-05 DIAGNOSIS — M48061 Spinal stenosis, lumbar region without neurogenic claudication: Secondary | ICD-10-CM | POA: Diagnosis not present

## 2020-04-05 DIAGNOSIS — G8929 Other chronic pain: Secondary | ICD-10-CM | POA: Diagnosis not present

## 2020-04-05 DIAGNOSIS — D62 Acute posthemorrhagic anemia: Secondary | ICD-10-CM | POA: Diagnosis not present

## 2020-04-05 DIAGNOSIS — S91312A Laceration without foreign body, left foot, initial encounter: Secondary | ICD-10-CM | POA: Diagnosis not present

## 2020-04-07 DIAGNOSIS — Z20828 Contact with and (suspected) exposure to other viral communicable diseases: Secondary | ICD-10-CM | POA: Diagnosis not present

## 2020-04-08 DIAGNOSIS — M48061 Spinal stenosis, lumbar region without neurogenic claudication: Secondary | ICD-10-CM | POA: Diagnosis not present

## 2020-04-08 DIAGNOSIS — D62 Acute posthemorrhagic anemia: Secondary | ICD-10-CM | POA: Diagnosis not present

## 2020-04-08 DIAGNOSIS — E871 Hypo-osmolality and hyponatremia: Secondary | ICD-10-CM | POA: Diagnosis not present

## 2020-04-08 DIAGNOSIS — M8008XD Age-related osteoporosis with current pathological fracture, vertebra(e), subsequent encounter for fracture with routine healing: Secondary | ICD-10-CM | POA: Diagnosis not present

## 2020-04-08 DIAGNOSIS — G8929 Other chronic pain: Secondary | ICD-10-CM | POA: Diagnosis not present

## 2020-04-08 DIAGNOSIS — S91312A Laceration without foreign body, left foot, initial encounter: Secondary | ICD-10-CM | POA: Diagnosis not present

## 2020-04-14 DIAGNOSIS — Z20828 Contact with and (suspected) exposure to other viral communicable diseases: Secondary | ICD-10-CM | POA: Diagnosis not present

## 2020-04-14 DIAGNOSIS — R4182 Altered mental status, unspecified: Secondary | ICD-10-CM | POA: Diagnosis not present

## 2020-04-14 DIAGNOSIS — M6281 Muscle weakness (generalized): Secondary | ICD-10-CM | POA: Diagnosis not present

## 2020-04-14 DIAGNOSIS — R5381 Other malaise: Secondary | ICD-10-CM | POA: Diagnosis not present

## 2020-04-14 DIAGNOSIS — R54 Age-related physical debility: Secondary | ICD-10-CM | POA: Diagnosis not present

## 2020-04-14 DIAGNOSIS — Z87440 Personal history of urinary (tract) infections: Secondary | ICD-10-CM | POA: Diagnosis not present

## 2020-04-15 DIAGNOSIS — N390 Urinary tract infection, site not specified: Secondary | ICD-10-CM | POA: Diagnosis not present

## 2020-04-15 DIAGNOSIS — D500 Iron deficiency anemia secondary to blood loss (chronic): Secondary | ICD-10-CM | POA: Diagnosis not present

## 2020-04-15 DIAGNOSIS — I10 Essential (primary) hypertension: Secondary | ICD-10-CM | POA: Diagnosis not present

## 2020-04-15 DIAGNOSIS — G8929 Other chronic pain: Secondary | ICD-10-CM | POA: Diagnosis not present

## 2020-04-15 DIAGNOSIS — D62 Acute posthemorrhagic anemia: Secondary | ICD-10-CM | POA: Diagnosis not present

## 2020-04-15 DIAGNOSIS — R5381 Other malaise: Secondary | ICD-10-CM | POA: Diagnosis not present

## 2020-04-15 DIAGNOSIS — M8008XD Age-related osteoporosis with current pathological fracture, vertebra(e), subsequent encounter for fracture with routine healing: Secondary | ICD-10-CM | POA: Diagnosis not present

## 2020-04-15 DIAGNOSIS — S91312A Laceration without foreign body, left foot, initial encounter: Secondary | ICD-10-CM | POA: Diagnosis not present

## 2020-04-15 DIAGNOSIS — E871 Hypo-osmolality and hyponatremia: Secondary | ICD-10-CM | POA: Diagnosis not present

## 2020-04-15 DIAGNOSIS — M48061 Spinal stenosis, lumbar region without neurogenic claudication: Secondary | ICD-10-CM | POA: Diagnosis not present

## 2020-04-16 DIAGNOSIS — I2510 Atherosclerotic heart disease of native coronary artery without angina pectoris: Secondary | ICD-10-CM | POA: Diagnosis not present

## 2020-04-16 DIAGNOSIS — I4891 Unspecified atrial fibrillation: Secondary | ICD-10-CM | POA: Diagnosis not present

## 2020-04-16 DIAGNOSIS — F419 Anxiety disorder, unspecified: Secondary | ICD-10-CM | POA: Diagnosis not present

## 2020-04-16 DIAGNOSIS — G4700 Insomnia, unspecified: Secondary | ICD-10-CM | POA: Diagnosis not present

## 2020-04-16 DIAGNOSIS — I252 Old myocardial infarction: Secondary | ICD-10-CM | POA: Diagnosis not present

## 2020-04-16 DIAGNOSIS — M1990 Unspecified osteoarthritis, unspecified site: Secondary | ICD-10-CM | POA: Diagnosis not present

## 2020-04-16 DIAGNOSIS — Z96651 Presence of right artificial knee joint: Secondary | ICD-10-CM | POA: Diagnosis not present

## 2020-04-16 DIAGNOSIS — K222 Esophageal obstruction: Secondary | ICD-10-CM | POA: Diagnosis not present

## 2020-04-16 DIAGNOSIS — M48061 Spinal stenosis, lumbar region without neurogenic claudication: Secondary | ICD-10-CM | POA: Diagnosis not present

## 2020-04-16 DIAGNOSIS — M8008XD Age-related osteoporosis with current pathological fracture, vertebra(e), subsequent encounter for fracture with routine healing: Secondary | ICD-10-CM | POA: Diagnosis not present

## 2020-04-16 DIAGNOSIS — K5900 Constipation, unspecified: Secondary | ICD-10-CM | POA: Diagnosis not present

## 2020-04-16 DIAGNOSIS — I10 Essential (primary) hypertension: Secondary | ICD-10-CM | POA: Diagnosis not present

## 2020-04-16 DIAGNOSIS — K219 Gastro-esophageal reflux disease without esophagitis: Secondary | ICD-10-CM | POA: Diagnosis not present

## 2020-04-16 DIAGNOSIS — Z7901 Long term (current) use of anticoagulants: Secondary | ICD-10-CM | POA: Diagnosis not present

## 2020-04-16 DIAGNOSIS — S91312A Laceration without foreign body, left foot, initial encounter: Secondary | ICD-10-CM | POA: Diagnosis not present

## 2020-04-16 DIAGNOSIS — D62 Acute posthemorrhagic anemia: Secondary | ICD-10-CM | POA: Diagnosis not present

## 2020-04-16 DIAGNOSIS — M109 Gout, unspecified: Secondary | ICD-10-CM | POA: Diagnosis not present

## 2020-04-16 DIAGNOSIS — Z8673 Personal history of transient ischemic attack (TIA), and cerebral infarction without residual deficits: Secondary | ICD-10-CM | POA: Diagnosis not present

## 2020-04-16 DIAGNOSIS — E46 Unspecified protein-calorie malnutrition: Secondary | ICD-10-CM | POA: Diagnosis not present

## 2020-04-16 DIAGNOSIS — S51812D Laceration without foreign body of left forearm, subsequent encounter: Secondary | ICD-10-CM | POA: Diagnosis not present

## 2020-04-16 DIAGNOSIS — E7800 Pure hypercholesterolemia, unspecified: Secondary | ICD-10-CM | POA: Diagnosis not present

## 2020-04-16 DIAGNOSIS — Z7982 Long term (current) use of aspirin: Secondary | ICD-10-CM | POA: Diagnosis not present

## 2020-04-16 DIAGNOSIS — G8929 Other chronic pain: Secondary | ICD-10-CM | POA: Diagnosis not present

## 2020-04-16 DIAGNOSIS — E871 Hypo-osmolality and hyponatremia: Secondary | ICD-10-CM | POA: Diagnosis not present

## 2020-04-16 DIAGNOSIS — Z9181 History of falling: Secondary | ICD-10-CM | POA: Diagnosis not present

## 2020-04-18 DIAGNOSIS — R52 Pain, unspecified: Secondary | ICD-10-CM | POA: Diagnosis not present

## 2020-04-18 DIAGNOSIS — I110 Hypertensive heart disease with heart failure: Secondary | ICD-10-CM | POA: Diagnosis not present

## 2020-04-18 DIAGNOSIS — M25552 Pain in left hip: Secondary | ICD-10-CM | POA: Diagnosis not present

## 2020-04-18 DIAGNOSIS — M542 Cervicalgia: Secondary | ICD-10-CM | POA: Diagnosis not present

## 2020-04-18 DIAGNOSIS — I10 Essential (primary) hypertension: Secondary | ICD-10-CM | POA: Diagnosis not present

## 2020-04-18 DIAGNOSIS — Z043 Encounter for examination and observation following other accident: Secondary | ICD-10-CM | POA: Diagnosis not present

## 2020-04-18 DIAGNOSIS — J90 Pleural effusion, not elsewhere classified: Secondary | ICD-10-CM | POA: Diagnosis not present

## 2020-04-18 DIAGNOSIS — S0990XA Unspecified injury of head, initial encounter: Secondary | ICD-10-CM | POA: Diagnosis not present

## 2020-04-18 DIAGNOSIS — I517 Cardiomegaly: Secondary | ICD-10-CM | POA: Diagnosis not present

## 2020-04-18 DIAGNOSIS — S3993XA Unspecified injury of pelvis, initial encounter: Secondary | ICD-10-CM | POA: Diagnosis not present

## 2020-04-18 DIAGNOSIS — R0902 Hypoxemia: Secondary | ICD-10-CM | POA: Diagnosis not present

## 2020-04-18 DIAGNOSIS — S22080A Wedge compression fracture of T11-T12 vertebra, initial encounter for closed fracture: Secondary | ICD-10-CM | POA: Diagnosis not present

## 2020-04-18 DIAGNOSIS — I509 Heart failure, unspecified: Secondary | ICD-10-CM | POA: Diagnosis not present

## 2020-04-18 DIAGNOSIS — M545 Low back pain: Secondary | ICD-10-CM | POA: Diagnosis not present

## 2020-04-19 DIAGNOSIS — N390 Urinary tract infection, site not specified: Secondary | ICD-10-CM | POA: Diagnosis present

## 2020-04-19 DIAGNOSIS — M542 Cervicalgia: Secondary | ICD-10-CM | POA: Diagnosis not present

## 2020-04-19 DIAGNOSIS — I4811 Longstanding persistent atrial fibrillation: Secondary | ICD-10-CM | POA: Diagnosis present

## 2020-04-19 DIAGNOSIS — W19XXXA Unspecified fall, initial encounter: Secondary | ICD-10-CM | POA: Diagnosis not present

## 2020-04-19 DIAGNOSIS — Z66 Do not resuscitate: Secondary | ICD-10-CM | POA: Diagnosis present

## 2020-04-19 DIAGNOSIS — S22080D Wedge compression fracture of T11-T12 vertebra, subsequent encounter for fracture with routine healing: Secondary | ICD-10-CM | POA: Diagnosis not present

## 2020-04-19 DIAGNOSIS — I340 Nonrheumatic mitral (valve) insufficiency: Secondary | ICD-10-CM | POA: Diagnosis not present

## 2020-04-19 DIAGNOSIS — M5134 Other intervertebral disc degeneration, thoracic region: Secondary | ICD-10-CM | POA: Diagnosis present

## 2020-04-19 DIAGNOSIS — B952 Enterococcus as the cause of diseases classified elsewhere: Secondary | ICD-10-CM | POA: Diagnosis present

## 2020-04-19 DIAGNOSIS — I5033 Acute on chronic diastolic (congestive) heart failure: Secondary | ICD-10-CM | POA: Diagnosis present

## 2020-04-19 DIAGNOSIS — M4804 Spinal stenosis, thoracic region: Secondary | ICD-10-CM | POA: Diagnosis present

## 2020-04-19 DIAGNOSIS — F419 Anxiety disorder, unspecified: Secondary | ICD-10-CM | POA: Diagnosis present

## 2020-04-19 DIAGNOSIS — I361 Nonrheumatic tricuspid (valve) insufficiency: Secondary | ICD-10-CM | POA: Diagnosis not present

## 2020-04-19 DIAGNOSIS — I5031 Acute diastolic (congestive) heart failure: Secondary | ICD-10-CM | POA: Diagnosis not present

## 2020-04-19 DIAGNOSIS — J9691 Respiratory failure, unspecified with hypoxia: Secondary | ICD-10-CM | POA: Diagnosis not present

## 2020-04-19 DIAGNOSIS — G9341 Metabolic encephalopathy: Secondary | ICD-10-CM | POA: Diagnosis present

## 2020-04-19 DIAGNOSIS — I110 Hypertensive heart disease with heart failure: Secondary | ICD-10-CM | POA: Diagnosis present

## 2020-04-19 DIAGNOSIS — J9601 Acute respiratory failure with hypoxia: Secondary | ICD-10-CM | POA: Diagnosis not present

## 2020-04-19 DIAGNOSIS — E222 Syndrome of inappropriate secretion of antidiuretic hormone: Secondary | ICD-10-CM | POA: Diagnosis present

## 2020-04-19 DIAGNOSIS — R0602 Shortness of breath: Secondary | ICD-10-CM | POA: Diagnosis not present

## 2020-04-19 DIAGNOSIS — Z9089 Acquired absence of other organs: Secondary | ICD-10-CM | POA: Diagnosis not present

## 2020-04-19 DIAGNOSIS — N179 Acute kidney failure, unspecified: Secondary | ICD-10-CM | POA: Diagnosis not present

## 2020-04-19 DIAGNOSIS — Z4789 Encounter for other orthopedic aftercare: Secondary | ICD-10-CM | POA: Diagnosis not present

## 2020-04-19 DIAGNOSIS — B9620 Unspecified Escherichia coli [E. coli] as the cause of diseases classified elsewhere: Secondary | ICD-10-CM | POA: Diagnosis present

## 2020-04-19 DIAGNOSIS — R69 Illness, unspecified: Secondary | ICD-10-CM | POA: Diagnosis not present

## 2020-04-19 DIAGNOSIS — R0902 Hypoxemia: Secondary | ICD-10-CM | POA: Diagnosis not present

## 2020-04-19 DIAGNOSIS — D649 Anemia, unspecified: Secondary | ICD-10-CM | POA: Diagnosis present

## 2020-04-19 DIAGNOSIS — S22080A Wedge compression fracture of T11-T12 vertebra, initial encounter for closed fracture: Secondary | ICD-10-CM | POA: Diagnosis present

## 2020-04-19 DIAGNOSIS — S0990XA Unspecified injury of head, initial encounter: Secondary | ICD-10-CM | POA: Diagnosis not present

## 2020-04-19 DIAGNOSIS — S3993XA Unspecified injury of pelvis, initial encounter: Secondary | ICD-10-CM | POA: Diagnosis not present

## 2020-04-19 DIAGNOSIS — Z8701 Personal history of pneumonia (recurrent): Secondary | ICD-10-CM | POA: Diagnosis not present

## 2020-04-19 DIAGNOSIS — I2510 Atherosclerotic heart disease of native coronary artery without angina pectoris: Secondary | ICD-10-CM | POA: Diagnosis present

## 2020-04-19 DIAGNOSIS — R5381 Other malaise: Secondary | ICD-10-CM | POA: Diagnosis not present

## 2020-04-19 DIAGNOSIS — I4891 Unspecified atrial fibrillation: Secondary | ICD-10-CM | POA: Diagnosis not present

## 2020-04-19 DIAGNOSIS — E119 Type 2 diabetes mellitus without complications: Secondary | ICD-10-CM | POA: Diagnosis not present

## 2020-04-19 DIAGNOSIS — I517 Cardiomegaly: Secondary | ICD-10-CM | POA: Diagnosis not present

## 2020-04-19 DIAGNOSIS — M549 Dorsalgia, unspecified: Secondary | ICD-10-CM | POA: Diagnosis present

## 2020-04-19 DIAGNOSIS — Z743 Need for continuous supervision: Secondary | ICD-10-CM | POA: Diagnosis not present

## 2020-04-19 DIAGNOSIS — Z043 Encounter for examination and observation following other accident: Secondary | ICD-10-CM | POA: Diagnosis not present

## 2020-04-19 DIAGNOSIS — E43 Unspecified severe protein-calorie malnutrition: Secondary | ICD-10-CM | POA: Diagnosis not present

## 2020-04-19 DIAGNOSIS — I959 Hypotension, unspecified: Secondary | ICD-10-CM | POA: Diagnosis present

## 2020-04-19 DIAGNOSIS — J90 Pleural effusion, not elsewhere classified: Secondary | ICD-10-CM | POA: Diagnosis present

## 2020-04-19 DIAGNOSIS — I10 Essential (primary) hypertension: Secondary | ICD-10-CM | POA: Diagnosis not present

## 2020-04-19 DIAGNOSIS — K219 Gastro-esophageal reflux disease without esophagitis: Secondary | ICD-10-CM | POA: Diagnosis present

## 2020-04-19 DIAGNOSIS — I509 Heart failure, unspecified: Secondary | ICD-10-CM | POA: Diagnosis not present

## 2020-04-19 DIAGNOSIS — R262 Difficulty in walking, not elsewhere classified: Secondary | ICD-10-CM | POA: Diagnosis present

## 2020-04-19 DIAGNOSIS — I350 Nonrheumatic aortic (valve) stenosis: Secondary | ICD-10-CM | POA: Diagnosis not present

## 2020-04-19 DIAGNOSIS — I252 Old myocardial infarction: Secondary | ICD-10-CM | POA: Diagnosis not present

## 2020-04-19 DIAGNOSIS — F329 Major depressive disorder, single episode, unspecified: Secondary | ICD-10-CM | POA: Diagnosis present

## 2020-04-19 DIAGNOSIS — M810 Age-related osteoporosis without current pathological fracture: Secondary | ICD-10-CM | POA: Diagnosis present

## 2020-04-26 DIAGNOSIS — J9601 Acute respiratory failure with hypoxia: Secondary | ICD-10-CM | POA: Diagnosis not present

## 2020-04-26 DIAGNOSIS — M810 Age-related osteoporosis without current pathological fracture: Secondary | ICD-10-CM | POA: Diagnosis not present

## 2020-04-26 DIAGNOSIS — F4324 Adjustment disorder with disturbance of conduct: Secondary | ICD-10-CM | POA: Diagnosis not present

## 2020-04-26 DIAGNOSIS — N390 Urinary tract infection, site not specified: Secondary | ICD-10-CM | POA: Diagnosis not present

## 2020-04-26 DIAGNOSIS — S22080D Wedge compression fracture of T11-T12 vertebra, subsequent encounter for fracture with routine healing: Secondary | ICD-10-CM | POA: Diagnosis not present

## 2020-04-26 DIAGNOSIS — W19XXXA Unspecified fall, initial encounter: Secondary | ICD-10-CM | POA: Diagnosis not present

## 2020-04-26 DIAGNOSIS — J9691 Respiratory failure, unspecified with hypoxia: Secondary | ICD-10-CM | POA: Diagnosis not present

## 2020-04-26 DIAGNOSIS — R69 Illness, unspecified: Secondary | ICD-10-CM | POA: Diagnosis not present

## 2020-04-26 DIAGNOSIS — R262 Difficulty in walking, not elsewhere classified: Secondary | ICD-10-CM | POA: Diagnosis not present

## 2020-04-26 DIAGNOSIS — L988 Other specified disorders of the skin and subcutaneous tissue: Secondary | ICD-10-CM | POA: Diagnosis not present

## 2020-04-26 DIAGNOSIS — Z4789 Encounter for other orthopedic aftercare: Secondary | ICD-10-CM | POA: Diagnosis not present

## 2020-04-26 DIAGNOSIS — I5032 Chronic diastolic (congestive) heart failure: Secondary | ICD-10-CM | POA: Diagnosis not present

## 2020-04-26 DIAGNOSIS — E119 Type 2 diabetes mellitus without complications: Secondary | ICD-10-CM | POA: Diagnosis not present

## 2020-04-26 DIAGNOSIS — E43 Unspecified severe protein-calorie malnutrition: Secondary | ICD-10-CM | POA: Diagnosis not present

## 2020-04-26 DIAGNOSIS — G9341 Metabolic encephalopathy: Secondary | ICD-10-CM | POA: Diagnosis not present

## 2020-04-26 DIAGNOSIS — B9620 Unspecified Escherichia coli [E. coli] as the cause of diseases classified elsewhere: Secondary | ICD-10-CM | POA: Diagnosis not present

## 2020-04-26 DIAGNOSIS — I959 Hypotension, unspecified: Secondary | ICD-10-CM | POA: Diagnosis not present

## 2020-04-26 DIAGNOSIS — I5031 Acute diastolic (congestive) heart failure: Secondary | ICD-10-CM | POA: Diagnosis not present

## 2020-04-26 DIAGNOSIS — R443 Hallucinations, unspecified: Secondary | ICD-10-CM | POA: Diagnosis not present

## 2020-04-26 DIAGNOSIS — Z743 Need for continuous supervision: Secondary | ICD-10-CM | POA: Diagnosis not present

## 2020-04-26 DIAGNOSIS — N179 Acute kidney failure, unspecified: Secondary | ICD-10-CM | POA: Diagnosis not present

## 2020-04-26 DIAGNOSIS — R5381 Other malaise: Secondary | ICD-10-CM | POA: Diagnosis not present

## 2020-04-26 DIAGNOSIS — I4891 Unspecified atrial fibrillation: Secondary | ICD-10-CM | POA: Diagnosis not present

## 2020-04-29 DIAGNOSIS — R262 Difficulty in walking, not elsewhere classified: Secondary | ICD-10-CM | POA: Diagnosis not present

## 2020-04-29 DIAGNOSIS — M810 Age-related osteoporosis without current pathological fracture: Secondary | ICD-10-CM | POA: Diagnosis not present

## 2020-04-29 DIAGNOSIS — I5032 Chronic diastolic (congestive) heart failure: Secondary | ICD-10-CM | POA: Diagnosis not present

## 2020-04-29 DIAGNOSIS — F4324 Adjustment disorder with disturbance of conduct: Secondary | ICD-10-CM | POA: Diagnosis not present

## 2020-04-29 DIAGNOSIS — N390 Urinary tract infection, site not specified: Secondary | ICD-10-CM | POA: Diagnosis not present

## 2020-05-11 DIAGNOSIS — L988 Other specified disorders of the skin and subcutaneous tissue: Secondary | ICD-10-CM | POA: Diagnosis not present

## 2020-05-13 DIAGNOSIS — F4324 Adjustment disorder with disturbance of conduct: Secondary | ICD-10-CM | POA: Diagnosis not present

## 2020-05-13 DIAGNOSIS — R443 Hallucinations, unspecified: Secondary | ICD-10-CM | POA: Diagnosis not present

## 2020-05-20 ENCOUNTER — Other Ambulatory Visit: Payer: Self-pay | Admitting: *Deleted

## 2020-05-20 NOTE — Patient Outreach (Signed)
Member screened for potential [facility] Care Management needs as a benefit of [place] Medicare.  Per Patient [name] member resides in [place] [facility].   Communication sent to SNF SW to collaborate about anticipated dc plans and potential [facility] Care Management needs.   Marthenia Rolling, MSN-Ed, RN,BSN [place] Acute Care Coordinator [phone] [facility]) [phone]  (Toll free office)

## 2020-05-26 DIAGNOSIS — E43 Unspecified severe protein-calorie malnutrition: Secondary | ICD-10-CM | POA: Diagnosis not present

## 2020-05-26 DIAGNOSIS — S81801A Unspecified open wound, right lower leg, initial encounter: Secondary | ICD-10-CM | POA: Diagnosis not present

## 2020-05-26 DIAGNOSIS — S22080D Wedge compression fracture of T11-T12 vertebra, subsequent encounter for fracture with routine healing: Secondary | ICD-10-CM | POA: Diagnosis not present

## 2020-05-26 DIAGNOSIS — I959 Hypotension, unspecified: Secondary | ICD-10-CM | POA: Diagnosis not present

## 2020-05-26 DIAGNOSIS — I5032 Chronic diastolic (congestive) heart failure: Secondary | ICD-10-CM | POA: Diagnosis not present

## 2020-05-26 DIAGNOSIS — M810 Age-related osteoporosis without current pathological fracture: Secondary | ICD-10-CM | POA: Diagnosis not present

## 2020-05-26 DIAGNOSIS — J9601 Acute respiratory failure with hypoxia: Secondary | ICD-10-CM | POA: Diagnosis not present

## 2020-05-26 DIAGNOSIS — N390 Urinary tract infection, site not specified: Secondary | ICD-10-CM | POA: Diagnosis not present

## 2020-05-26 DIAGNOSIS — S81802A Unspecified open wound, left lower leg, initial encounter: Secondary | ICD-10-CM | POA: Diagnosis not present

## 2020-05-31 DIAGNOSIS — R634 Abnormal weight loss: Secondary | ICD-10-CM | POA: Diagnosis not present

## 2020-05-31 DIAGNOSIS — E871 Hypo-osmolality and hyponatremia: Secondary | ICD-10-CM | POA: Diagnosis not present

## 2020-05-31 DIAGNOSIS — R600 Localized edema: Secondary | ICD-10-CM | POA: Diagnosis not present

## 2020-05-31 DIAGNOSIS — R54 Age-related physical debility: Secondary | ICD-10-CM | POA: Diagnosis not present

## 2020-06-16 ENCOUNTER — Other Ambulatory Visit: Payer: Self-pay | Admitting: *Deleted

## 2020-06-16 DIAGNOSIS — R269 Unspecified abnormalities of gait and mobility: Secondary | ICD-10-CM | POA: Diagnosis not present

## 2020-06-16 DIAGNOSIS — M6281 Muscle weakness (generalized): Secondary | ICD-10-CM | POA: Diagnosis not present

## 2020-06-16 DIAGNOSIS — R600 Localized edema: Secondary | ICD-10-CM | POA: Diagnosis not present

## 2020-06-16 DIAGNOSIS — R54 Age-related physical debility: Secondary | ICD-10-CM | POA: Diagnosis not present

## 2020-06-16 NOTE — Patient Outreach (Signed)
[place] Coordinator follow up. Verified in Patient [name] that Mrs. Korf transitioned to ALF from [facility].  No identifiable [facility] Care Management needs at this time.    Marthenia Rolling, MSN-Ed, RN,BSN [place] Acute Care Coordinator [phone] [facility]) [phone]  (Toll free office)

## 2020-06-21 DIAGNOSIS — M6281 Muscle weakness (generalized): Secondary | ICD-10-CM | POA: Diagnosis not present

## 2020-06-21 DIAGNOSIS — M1990 Unspecified osteoarthritis, unspecified site: Secondary | ICD-10-CM | POA: Diagnosis not present

## 2020-06-21 DIAGNOSIS — R269 Unspecified abnormalities of gait and mobility: Secondary | ICD-10-CM | POA: Diagnosis not present

## 2020-06-21 DIAGNOSIS — G894 Chronic pain syndrome: Secondary | ICD-10-CM | POA: Diagnosis not present

## 2020-06-21 DIAGNOSIS — M545 Low back pain, unspecified: Secondary | ICD-10-CM | POA: Diagnosis not present

## 2020-06-21 DIAGNOSIS — R54 Age-related physical debility: Secondary | ICD-10-CM | POA: Diagnosis not present

## 2020-06-28 DIAGNOSIS — M6281 Muscle weakness (generalized): Secondary | ICD-10-CM | POA: Diagnosis not present

## 2020-06-28 DIAGNOSIS — E871 Hypo-osmolality and hyponatremia: Secondary | ICD-10-CM | POA: Diagnosis not present

## 2020-06-28 DIAGNOSIS — R269 Unspecified abnormalities of gait and mobility: Secondary | ICD-10-CM | POA: Diagnosis not present

## 2020-06-28 DIAGNOSIS — G894 Chronic pain syndrome: Secondary | ICD-10-CM | POA: Diagnosis not present

## 2020-06-28 DIAGNOSIS — Z9181 History of falling: Secondary | ICD-10-CM | POA: Diagnosis not present

## 2020-06-28 DIAGNOSIS — R54 Age-related physical debility: Secondary | ICD-10-CM | POA: Diagnosis not present

## 2020-06-28 DIAGNOSIS — M545 Low back pain, unspecified: Secondary | ICD-10-CM | POA: Diagnosis not present

## 2020-06-28 DIAGNOSIS — M4800 Spinal stenosis, site unspecified: Secondary | ICD-10-CM | POA: Diagnosis not present

## 2020-06-29 DIAGNOSIS — I4891 Unspecified atrial fibrillation: Secondary | ICD-10-CM | POA: Diagnosis not present

## 2020-06-29 DIAGNOSIS — I10 Essential (primary) hypertension: Secondary | ICD-10-CM | POA: Diagnosis not present

## 2020-06-29 DIAGNOSIS — Z7401 Bed confinement status: Secondary | ICD-10-CM | POA: Diagnosis not present

## 2020-06-29 DIAGNOSIS — I482 Chronic atrial fibrillation, unspecified: Secondary | ICD-10-CM | POA: Diagnosis present

## 2020-06-29 DIAGNOSIS — J189 Pneumonia, unspecified organism: Secondary | ICD-10-CM | POA: Diagnosis not present

## 2020-06-29 DIAGNOSIS — E785 Hyperlipidemia, unspecified: Secondary | ICD-10-CM | POA: Diagnosis present

## 2020-06-29 DIAGNOSIS — M810 Age-related osteoporosis without current pathological fracture: Secondary | ICD-10-CM | POA: Diagnosis present

## 2020-06-29 DIAGNOSIS — J9601 Acute respiratory failure with hypoxia: Secondary | ICD-10-CM | POA: Diagnosis not present

## 2020-06-29 DIAGNOSIS — K529 Noninfective gastroenteritis and colitis, unspecified: Secondary | ICD-10-CM | POA: Diagnosis not present

## 2020-06-29 DIAGNOSIS — I639 Cerebral infarction, unspecified: Secondary | ICD-10-CM | POA: Diagnosis not present

## 2020-06-29 DIAGNOSIS — R2689 Other abnormalities of gait and mobility: Secondary | ICD-10-CM | POA: Diagnosis not present

## 2020-06-29 DIAGNOSIS — A419 Sepsis, unspecified organism: Secondary | ICD-10-CM | POA: Diagnosis not present

## 2020-06-29 DIAGNOSIS — D638 Anemia in other chronic diseases classified elsewhere: Secondary | ICD-10-CM | POA: Diagnosis present

## 2020-06-29 DIAGNOSIS — K5900 Constipation, unspecified: Secondary | ICD-10-CM | POA: Diagnosis not present

## 2020-06-29 DIAGNOSIS — J811 Chronic pulmonary edema: Secondary | ICD-10-CM | POA: Diagnosis not present

## 2020-06-29 DIAGNOSIS — Z20822 Contact with and (suspected) exposure to covid-19: Secondary | ICD-10-CM | POA: Diagnosis present

## 2020-06-29 DIAGNOSIS — Z66 Do not resuscitate: Secondary | ICD-10-CM | POA: Diagnosis present

## 2020-06-29 DIAGNOSIS — R0902 Hypoxemia: Secondary | ICD-10-CM | POA: Diagnosis not present

## 2020-06-29 DIAGNOSIS — I2510 Atherosclerotic heart disease of native coronary artery without angina pectoris: Secondary | ICD-10-CM | POA: Diagnosis present

## 2020-06-29 DIAGNOSIS — F339 Major depressive disorder, recurrent, unspecified: Secondary | ICD-10-CM | POA: Diagnosis not present

## 2020-06-29 DIAGNOSIS — Z7902 Long term (current) use of antithrombotics/antiplatelets: Secondary | ICD-10-CM | POA: Diagnosis not present

## 2020-06-29 DIAGNOSIS — G9341 Metabolic encephalopathy: Secondary | ICD-10-CM | POA: Diagnosis not present

## 2020-06-29 DIAGNOSIS — J90 Pleural effusion, not elsewhere classified: Secondary | ICD-10-CM | POA: Diagnosis not present

## 2020-06-29 DIAGNOSIS — K567 Ileus, unspecified: Secondary | ICD-10-CM | POA: Diagnosis not present

## 2020-06-29 DIAGNOSIS — R54 Age-related physical debility: Secondary | ICD-10-CM | POA: Diagnosis not present

## 2020-06-29 DIAGNOSIS — I5032 Chronic diastolic (congestive) heart failure: Secondary | ICD-10-CM | POA: Diagnosis present

## 2020-06-29 DIAGNOSIS — M1990 Unspecified osteoarthritis, unspecified site: Secondary | ICD-10-CM | POA: Diagnosis present

## 2020-06-29 DIAGNOSIS — J984 Other disorders of lung: Secondary | ICD-10-CM | POA: Diagnosis not present

## 2020-06-29 DIAGNOSIS — E222 Syndrome of inappropriate secretion of antidiuretic hormone: Secondary | ICD-10-CM | POA: Diagnosis not present

## 2020-06-29 DIAGNOSIS — E440 Moderate protein-calorie malnutrition: Secondary | ICD-10-CM | POA: Diagnosis present

## 2020-06-29 DIAGNOSIS — F0390 Unspecified dementia without behavioral disturbance: Secondary | ICD-10-CM | POA: Diagnosis not present

## 2020-06-29 DIAGNOSIS — R278 Other lack of coordination: Secondary | ICD-10-CM | POA: Diagnosis not present

## 2020-06-29 DIAGNOSIS — K449 Diaphragmatic hernia without obstruction or gangrene: Secondary | ICD-10-CM | POA: Diagnosis not present

## 2020-06-29 DIAGNOSIS — Z8673 Personal history of transient ischemic attack (TIA), and cerebral infarction without residual deficits: Secondary | ICD-10-CM | POA: Diagnosis not present

## 2020-06-29 DIAGNOSIS — F419 Anxiety disorder, unspecified: Secondary | ICD-10-CM | POA: Diagnosis present

## 2020-06-29 DIAGNOSIS — M6281 Muscle weakness (generalized): Secondary | ICD-10-CM | POA: Diagnosis not present

## 2020-06-29 DIAGNOSIS — M818 Other osteoporosis without current pathological fracture: Secondary | ICD-10-CM | POA: Diagnosis not present

## 2020-06-29 DIAGNOSIS — I252 Old myocardial infarction: Secondary | ICD-10-CM | POA: Diagnosis not present

## 2020-06-29 DIAGNOSIS — F32A Depression, unspecified: Secondary | ICD-10-CM | POA: Diagnosis present

## 2020-06-29 DIAGNOSIS — J9811 Atelectasis: Secondary | ICD-10-CM | POA: Diagnosis not present

## 2020-06-29 DIAGNOSIS — I517 Cardiomegaly: Secondary | ICD-10-CM | POA: Diagnosis not present

## 2020-06-29 DIAGNOSIS — R410 Disorientation, unspecified: Secondary | ICD-10-CM | POA: Diagnosis not present

## 2020-06-29 DIAGNOSIS — I110 Hypertensive heart disease with heart failure: Secondary | ICD-10-CM | POA: Diagnosis present

## 2020-06-29 DIAGNOSIS — J9691 Respiratory failure, unspecified with hypoxia: Secondary | ICD-10-CM | POA: Diagnosis not present

## 2020-06-29 DIAGNOSIS — R109 Unspecified abdominal pain: Secondary | ICD-10-CM | POA: Diagnosis not present

## 2020-06-29 DIAGNOSIS — R1312 Dysphagia, oropharyngeal phase: Secondary | ICD-10-CM | POA: Diagnosis not present

## 2020-06-29 DIAGNOSIS — M2550 Pain in unspecified joint: Secondary | ICD-10-CM | POA: Diagnosis not present

## 2020-06-29 DIAGNOSIS — K8020 Calculus of gallbladder without cholecystitis without obstruction: Secondary | ICD-10-CM | POA: Diagnosis not present

## 2020-06-29 DIAGNOSIS — R509 Fever, unspecified: Secondary | ICD-10-CM | POA: Diagnosis not present

## 2020-06-29 DIAGNOSIS — K219 Gastro-esophageal reflux disease without esophagitis: Secondary | ICD-10-CM | POA: Diagnosis present

## 2020-06-29 DIAGNOSIS — R531 Weakness: Secondary | ICD-10-CM | POA: Diagnosis not present

## 2020-06-30 DIAGNOSIS — A419 Sepsis, unspecified organism: Secondary | ICD-10-CM | POA: Diagnosis not present

## 2020-06-30 DIAGNOSIS — J189 Pneumonia, unspecified organism: Secondary | ICD-10-CM | POA: Diagnosis not present

## 2020-06-30 DIAGNOSIS — J9601 Acute respiratory failure with hypoxia: Secondary | ICD-10-CM | POA: Diagnosis not present

## 2020-07-01 DIAGNOSIS — A419 Sepsis, unspecified organism: Secondary | ICD-10-CM | POA: Diagnosis not present

## 2020-07-01 DIAGNOSIS — J189 Pneumonia, unspecified organism: Secondary | ICD-10-CM | POA: Diagnosis not present

## 2020-07-01 DIAGNOSIS — J9601 Acute respiratory failure with hypoxia: Secondary | ICD-10-CM | POA: Diagnosis not present

## 2020-07-02 DIAGNOSIS — J189 Pneumonia, unspecified organism: Secondary | ICD-10-CM | POA: Diagnosis not present

## 2020-07-02 DIAGNOSIS — J9601 Acute respiratory failure with hypoxia: Secondary | ICD-10-CM | POA: Diagnosis not present

## 2020-07-02 DIAGNOSIS — A419 Sepsis, unspecified organism: Secondary | ICD-10-CM | POA: Diagnosis not present

## 2020-07-03 DIAGNOSIS — A419 Sepsis, unspecified organism: Secondary | ICD-10-CM | POA: Diagnosis not present

## 2020-07-03 DIAGNOSIS — J9601 Acute respiratory failure with hypoxia: Secondary | ICD-10-CM | POA: Diagnosis not present

## 2020-07-03 DIAGNOSIS — J189 Pneumonia, unspecified organism: Secondary | ICD-10-CM | POA: Diagnosis not present

## 2020-07-04 DIAGNOSIS — A419 Sepsis, unspecified organism: Secondary | ICD-10-CM | POA: Diagnosis not present

## 2020-07-04 DIAGNOSIS — J189 Pneumonia, unspecified organism: Secondary | ICD-10-CM | POA: Diagnosis not present

## 2020-07-04 DIAGNOSIS — J9601 Acute respiratory failure with hypoxia: Secondary | ICD-10-CM | POA: Diagnosis not present

## 2020-07-05 DIAGNOSIS — A419 Sepsis, unspecified organism: Secondary | ICD-10-CM | POA: Diagnosis not present

## 2020-07-05 DIAGNOSIS — J9601 Acute respiratory failure with hypoxia: Secondary | ICD-10-CM | POA: Diagnosis not present

## 2020-07-05 DIAGNOSIS — J189 Pneumonia, unspecified organism: Secondary | ICD-10-CM | POA: Diagnosis not present

## 2020-07-06 DIAGNOSIS — E440 Moderate protein-calorie malnutrition: Secondary | ICD-10-CM | POA: Diagnosis not present

## 2020-07-06 DIAGNOSIS — Z7401 Bed confinement status: Secondary | ICD-10-CM | POA: Diagnosis not present

## 2020-07-06 DIAGNOSIS — J9601 Acute respiratory failure with hypoxia: Secondary | ICD-10-CM | POA: Diagnosis not present

## 2020-07-06 DIAGNOSIS — J9621 Acute and chronic respiratory failure with hypoxia: Secondary | ICD-10-CM | POA: Diagnosis not present

## 2020-07-06 DIAGNOSIS — E785 Hyperlipidemia, unspecified: Secondary | ICD-10-CM | POA: Diagnosis not present

## 2020-07-06 DIAGNOSIS — E441 Mild protein-calorie malnutrition: Secondary | ICD-10-CM | POA: Diagnosis not present

## 2020-07-06 DIAGNOSIS — R2689 Other abnormalities of gait and mobility: Secondary | ICD-10-CM | POA: Diagnosis not present

## 2020-07-06 DIAGNOSIS — R262 Difficulty in walking, not elsewhere classified: Secondary | ICD-10-CM | POA: Diagnosis not present

## 2020-07-06 DIAGNOSIS — K529 Noninfective gastroenteritis and colitis, unspecified: Secondary | ICD-10-CM | POA: Diagnosis not present

## 2020-07-06 DIAGNOSIS — J9691 Respiratory failure, unspecified with hypoxia: Secondary | ICD-10-CM | POA: Diagnosis not present

## 2020-07-06 DIAGNOSIS — R1312 Dysphagia, oropharyngeal phase: Secondary | ICD-10-CM | POA: Diagnosis not present

## 2020-07-06 DIAGNOSIS — I4891 Unspecified atrial fibrillation: Secondary | ICD-10-CM | POA: Diagnosis not present

## 2020-07-06 DIAGNOSIS — J189 Pneumonia, unspecified organism: Secondary | ICD-10-CM | POA: Diagnosis not present

## 2020-07-06 DIAGNOSIS — R410 Disorientation, unspecified: Secondary | ICD-10-CM | POA: Diagnosis not present

## 2020-07-06 DIAGNOSIS — I2510 Atherosclerotic heart disease of native coronary artery without angina pectoris: Secondary | ICD-10-CM | POA: Diagnosis not present

## 2020-07-06 DIAGNOSIS — M6281 Muscle weakness (generalized): Secondary | ICD-10-CM | POA: Diagnosis not present

## 2020-07-06 DIAGNOSIS — R54 Age-related physical debility: Secondary | ICD-10-CM | POA: Diagnosis not present

## 2020-07-06 DIAGNOSIS — K5900 Constipation, unspecified: Secondary | ICD-10-CM | POA: Diagnosis not present

## 2020-07-06 DIAGNOSIS — I639 Cerebral infarction, unspecified: Secondary | ICD-10-CM | POA: Diagnosis not present

## 2020-07-06 DIAGNOSIS — K219 Gastro-esophageal reflux disease without esophagitis: Secondary | ICD-10-CM | POA: Diagnosis not present

## 2020-07-06 DIAGNOSIS — I10 Essential (primary) hypertension: Secondary | ICD-10-CM | POA: Diagnosis not present

## 2020-07-06 DIAGNOSIS — M2550 Pain in unspecified joint: Secondary | ICD-10-CM | POA: Diagnosis not present

## 2020-07-06 DIAGNOSIS — F32A Depression, unspecified: Secondary | ICD-10-CM | POA: Diagnosis not present

## 2020-07-06 DIAGNOSIS — G9341 Metabolic encephalopathy: Secondary | ICD-10-CM | POA: Diagnosis not present

## 2020-07-06 DIAGNOSIS — M818 Other osteoporosis without current pathological fracture: Secondary | ICD-10-CM | POA: Diagnosis not present

## 2020-07-06 DIAGNOSIS — F0390 Unspecified dementia without behavioral disturbance: Secondary | ICD-10-CM | POA: Diagnosis not present

## 2020-07-06 DIAGNOSIS — K567 Ileus, unspecified: Secondary | ICD-10-CM | POA: Diagnosis not present

## 2020-07-06 DIAGNOSIS — J90 Pleural effusion, not elsewhere classified: Secondary | ICD-10-CM | POA: Diagnosis not present

## 2020-07-06 DIAGNOSIS — I5032 Chronic diastolic (congestive) heart failure: Secondary | ICD-10-CM | POA: Diagnosis not present

## 2020-07-06 DIAGNOSIS — R278 Other lack of coordination: Secondary | ICD-10-CM | POA: Diagnosis not present

## 2020-07-06 DIAGNOSIS — A419 Sepsis, unspecified organism: Secondary | ICD-10-CM | POA: Diagnosis not present

## 2020-07-06 DIAGNOSIS — F339 Major depressive disorder, recurrent, unspecified: Secondary | ICD-10-CM | POA: Diagnosis not present

## 2020-07-06 DIAGNOSIS — D638 Anemia in other chronic diseases classified elsewhere: Secondary | ICD-10-CM | POA: Diagnosis not present

## 2020-07-06 DIAGNOSIS — R5381 Other malaise: Secondary | ICD-10-CM | POA: Diagnosis not present

## 2020-07-07 DIAGNOSIS — E441 Mild protein-calorie malnutrition: Secondary | ICD-10-CM | POA: Diagnosis not present

## 2020-07-07 DIAGNOSIS — R5381 Other malaise: Secondary | ICD-10-CM | POA: Diagnosis not present

## 2020-07-07 DIAGNOSIS — R262 Difficulty in walking, not elsewhere classified: Secondary | ICD-10-CM | POA: Diagnosis not present

## 2020-07-07 DIAGNOSIS — J9621 Acute and chronic respiratory failure with hypoxia: Secondary | ICD-10-CM | POA: Diagnosis not present

## 2020-07-08 DIAGNOSIS — F32A Depression, unspecified: Secondary | ICD-10-CM | POA: Diagnosis not present

## 2020-07-22 ENCOUNTER — Other Ambulatory Visit: Payer: Self-pay | Admitting: *Deleted

## 2020-07-22 DIAGNOSIS — F32A Depression, unspecified: Secondary | ICD-10-CM | POA: Diagnosis not present

## 2020-07-22 NOTE — Patient Outreach (Signed)
Member screened for potential [facility] Care Management needs as a benefit of [place] Medicare.  Verified in [place] ( Patient Sarah Reyes) that member resides in [place] [facility].   Communication sent to facility SW to inquire about transition plans and to assess for potential [facility] Care Management needs.   Will continue to follow while member resides in SNF.    Marthenia Rolling, MSN, RN,BSN [place] Acute Care Coordinator [phone] [facility]) [phone]  (Toll free office)

## 2020-07-23 ENCOUNTER — Other Ambulatory Visit: Payer: Self-pay | Admitting: *Deleted

## 2020-07-23 NOTE — Patient Outreach (Signed)
[facility] Post- Acute Care Coordinator follow up. Member screened for potential [facility] Care Management needs as a benefit of [place] Medicare.  Update received from [facility] indicating member is from ALF. States member plans to remain LTC.   No identifiable [facility] Care Management needs at this time.    Marthenia Rolling, MSN, RN,BSN [place] Acute Care Coordinator [phone] [facility]) [phone]  (Toll free office)

## 2020-08-07 DIAGNOSIS — Z8679 Personal history of other diseases of the circulatory system: Secondary | ICD-10-CM | POA: Diagnosis not present

## 2020-08-07 DIAGNOSIS — I4891 Unspecified atrial fibrillation: Secondary | ICD-10-CM | POA: Diagnosis not present

## 2020-08-07 DIAGNOSIS — Z20822 Contact with and (suspected) exposure to covid-19: Secondary | ICD-10-CM | POA: Diagnosis not present

## 2020-08-07 DIAGNOSIS — G8929 Other chronic pain: Secondary | ICD-10-CM | POA: Diagnosis not present

## 2020-08-07 DIAGNOSIS — I700 Atherosclerosis of aorta: Secondary | ICD-10-CM | POA: Diagnosis not present

## 2020-08-07 DIAGNOSIS — B351 Tinea unguium: Secondary | ICD-10-CM | POA: Diagnosis not present

## 2020-08-07 DIAGNOSIS — D649 Anemia, unspecified: Secondary | ICD-10-CM | POA: Diagnosis not present

## 2020-08-07 DIAGNOSIS — R404 Transient alteration of awareness: Secondary | ICD-10-CM | POA: Diagnosis not present

## 2020-08-07 DIAGNOSIS — Z954 Presence of other heart-valve replacement: Secondary | ICD-10-CM | POA: Diagnosis not present

## 2020-08-07 DIAGNOSIS — J9811 Atelectasis: Secondary | ICD-10-CM | POA: Diagnosis not present

## 2020-08-07 DIAGNOSIS — Z7982 Long term (current) use of aspirin: Secondary | ICD-10-CM | POA: Diagnosis not present

## 2020-08-07 DIAGNOSIS — D509 Iron deficiency anemia, unspecified: Secondary | ICD-10-CM | POA: Diagnosis not present

## 2020-08-07 DIAGNOSIS — E785 Hyperlipidemia, unspecified: Secondary | ICD-10-CM | POA: Diagnosis present

## 2020-08-07 DIAGNOSIS — I5032 Chronic diastolic (congestive) heart failure: Secondary | ICD-10-CM | POA: Diagnosis not present

## 2020-08-07 DIAGNOSIS — I480 Paroxysmal atrial fibrillation: Secondary | ICD-10-CM | POA: Diagnosis present

## 2020-08-07 DIAGNOSIS — Z7401 Bed confinement status: Secondary | ICD-10-CM | POA: Diagnosis not present

## 2020-08-07 DIAGNOSIS — I252 Old myocardial infarction: Secondary | ICD-10-CM | POA: Diagnosis not present

## 2020-08-07 DIAGNOSIS — J9601 Acute respiratory failure with hypoxia: Secondary | ICD-10-CM | POA: Diagnosis not present

## 2020-08-07 DIAGNOSIS — R069 Unspecified abnormalities of breathing: Secondary | ICD-10-CM | POA: Diagnosis not present

## 2020-08-07 DIAGNOSIS — M545 Low back pain, unspecified: Secondary | ICD-10-CM | POA: Diagnosis not present

## 2020-08-07 DIAGNOSIS — R0602 Shortness of breath: Secondary | ICD-10-CM | POA: Diagnosis not present

## 2020-08-07 DIAGNOSIS — M2550 Pain in unspecified joint: Secondary | ICD-10-CM | POA: Diagnosis not present

## 2020-08-07 DIAGNOSIS — I5033 Acute on chronic diastolic (congestive) heart failure: Secondary | ICD-10-CM | POA: Diagnosis present

## 2020-08-07 DIAGNOSIS — R195 Other fecal abnormalities: Secondary | ICD-10-CM | POA: Diagnosis not present

## 2020-08-07 DIAGNOSIS — J189 Pneumonia, unspecified organism: Secondary | ICD-10-CM | POA: Diagnosis present

## 2020-08-07 DIAGNOSIS — I083 Combined rheumatic disorders of mitral, aortic and tricuspid valves: Secondary | ICD-10-CM | POA: Diagnosis not present

## 2020-08-07 DIAGNOSIS — I639 Cerebral infarction, unspecified: Secondary | ICD-10-CM | POA: Diagnosis not present

## 2020-08-07 DIAGNOSIS — Z681 Body mass index (BMI) 19 or less, adult: Secondary | ICD-10-CM | POA: Diagnosis not present

## 2020-08-07 DIAGNOSIS — Z96698 Presence of other orthopedic joint implants: Secondary | ICD-10-CM | POA: Diagnosis not present

## 2020-08-07 DIAGNOSIS — R0902 Hypoxemia: Secondary | ICD-10-CM | POA: Diagnosis not present

## 2020-08-07 DIAGNOSIS — E43 Unspecified severe protein-calorie malnutrition: Secondary | ICD-10-CM | POA: Diagnosis present

## 2020-08-07 DIAGNOSIS — Z950 Presence of cardiac pacemaker: Secondary | ICD-10-CM | POA: Diagnosis not present

## 2020-08-07 DIAGNOSIS — H04123 Dry eye syndrome of bilateral lacrimal glands: Secondary | ICD-10-CM | POA: Diagnosis not present

## 2020-08-07 DIAGNOSIS — I454 Nonspecific intraventricular block: Secondary | ICD-10-CM | POA: Diagnosis not present

## 2020-08-07 DIAGNOSIS — I110 Hypertensive heart disease with heart failure: Secondary | ICD-10-CM | POA: Diagnosis present

## 2020-08-07 DIAGNOSIS — Z8673 Personal history of transient ischemic attack (TIA), and cerebral infarction without residual deficits: Secondary | ICD-10-CM | POA: Diagnosis not present

## 2020-08-07 DIAGNOSIS — I272 Pulmonary hypertension, unspecified: Secondary | ICD-10-CM | POA: Diagnosis not present

## 2020-08-07 DIAGNOSIS — F329 Major depressive disorder, single episode, unspecified: Secondary | ICD-10-CM | POA: Diagnosis not present

## 2020-08-07 DIAGNOSIS — I482 Chronic atrial fibrillation, unspecified: Secondary | ICD-10-CM | POA: Diagnosis not present

## 2020-08-07 DIAGNOSIS — D72829 Elevated white blood cell count, unspecified: Secondary | ICD-10-CM | POA: Diagnosis not present

## 2020-08-07 DIAGNOSIS — I509 Heart failure, unspecified: Secondary | ICD-10-CM | POA: Diagnosis not present

## 2020-08-07 DIAGNOSIS — Z955 Presence of coronary angioplasty implant and graft: Secondary | ICD-10-CM | POA: Diagnosis not present

## 2020-08-07 DIAGNOSIS — K449 Diaphragmatic hernia without obstruction or gangrene: Secondary | ICD-10-CM | POA: Diagnosis not present

## 2020-08-07 DIAGNOSIS — R410 Disorientation, unspecified: Secondary | ICD-10-CM | POA: Diagnosis not present

## 2020-08-07 DIAGNOSIS — Z952 Presence of prosthetic heart valve: Secondary | ICD-10-CM | POA: Diagnosis not present

## 2020-08-07 DIAGNOSIS — I248 Other forms of acute ischemic heart disease: Secondary | ICD-10-CM | POA: Diagnosis present

## 2020-08-07 DIAGNOSIS — J181 Lobar pneumonia, unspecified organism: Secondary | ICD-10-CM | POA: Diagnosis not present

## 2020-08-07 DIAGNOSIS — H9193 Unspecified hearing loss, bilateral: Secondary | ICD-10-CM | POA: Diagnosis not present

## 2020-08-07 DIAGNOSIS — M810 Age-related osteoporosis without current pathological fracture: Secondary | ICD-10-CM | POA: Diagnosis not present

## 2020-08-07 DIAGNOSIS — I350 Nonrheumatic aortic (valve) stenosis: Secondary | ICD-10-CM | POA: Diagnosis not present

## 2020-08-07 DIAGNOSIS — E876 Hypokalemia: Secondary | ICD-10-CM | POA: Diagnosis present

## 2020-08-07 DIAGNOSIS — S12112D Nondisplaced Type II dens fracture, subsequent encounter for fracture with routine healing: Secondary | ICD-10-CM | POA: Diagnosis not present

## 2020-08-07 DIAGNOSIS — R2681 Unsteadiness on feet: Secondary | ICD-10-CM | POA: Diagnosis not present

## 2020-08-07 DIAGNOSIS — Z8781 Personal history of (healed) traumatic fracture: Secondary | ICD-10-CM | POA: Diagnosis not present

## 2020-08-07 DIAGNOSIS — J90 Pleural effusion, not elsewhere classified: Secondary | ICD-10-CM | POA: Diagnosis not present

## 2020-08-07 DIAGNOSIS — E781 Pure hyperglyceridemia: Secondary | ICD-10-CM | POA: Diagnosis not present

## 2020-08-07 DIAGNOSIS — K921 Melena: Secondary | ICD-10-CM | POA: Diagnosis present

## 2020-08-07 DIAGNOSIS — I2510 Atherosclerotic heart disease of native coronary artery without angina pectoris: Secondary | ICD-10-CM | POA: Diagnosis present

## 2020-08-07 DIAGNOSIS — Z7901 Long term (current) use of anticoagulants: Secondary | ICD-10-CM | POA: Diagnosis not present

## 2020-08-07 DIAGNOSIS — D539 Nutritional anemia, unspecified: Secondary | ICD-10-CM | POA: Diagnosis present

## 2020-08-07 DIAGNOSIS — R0689 Other abnormalities of breathing: Secondary | ICD-10-CM | POA: Diagnosis not present

## 2020-08-07 DIAGNOSIS — R059 Cough, unspecified: Secondary | ICD-10-CM | POA: Diagnosis not present

## 2020-08-20 DIAGNOSIS — J90 Pleural effusion, not elsewhere classified: Secondary | ICD-10-CM | POA: Diagnosis not present

## 2020-08-20 DIAGNOSIS — E781 Pure hyperglyceridemia: Secondary | ICD-10-CM | POA: Diagnosis not present

## 2020-08-20 DIAGNOSIS — J9601 Acute respiratory failure with hypoxia: Secondary | ICD-10-CM | POA: Diagnosis not present

## 2020-08-20 DIAGNOSIS — I482 Chronic atrial fibrillation, unspecified: Secondary | ICD-10-CM | POA: Diagnosis not present

## 2020-08-20 DIAGNOSIS — I2510 Atherosclerotic heart disease of native coronary artery without angina pectoris: Secondary | ICD-10-CM | POA: Diagnosis not present

## 2020-08-20 DIAGNOSIS — I700 Atherosclerosis of aorta: Secondary | ICD-10-CM | POA: Diagnosis not present

## 2020-08-20 DIAGNOSIS — I509 Heart failure, unspecified: Secondary | ICD-10-CM | POA: Diagnosis not present

## 2020-08-20 DIAGNOSIS — E43 Unspecified severe protein-calorie malnutrition: Secondary | ICD-10-CM | POA: Diagnosis not present

## 2020-08-20 DIAGNOSIS — M810 Age-related osteoporosis without current pathological fracture: Secondary | ICD-10-CM | POA: Diagnosis not present

## 2020-08-20 DIAGNOSIS — E46 Unspecified protein-calorie malnutrition: Secondary | ICD-10-CM | POA: Diagnosis not present

## 2020-08-20 DIAGNOSIS — Z8673 Personal history of transient ischemic attack (TIA), and cerebral infarction without residual deficits: Secondary | ICD-10-CM | POA: Diagnosis not present

## 2020-08-20 DIAGNOSIS — H04123 Dry eye syndrome of bilateral lacrimal glands: Secondary | ICD-10-CM | POA: Diagnosis not present

## 2020-08-20 DIAGNOSIS — E876 Hypokalemia: Secondary | ICD-10-CM | POA: Diagnosis not present

## 2020-08-20 DIAGNOSIS — F411 Generalized anxiety disorder: Secondary | ICD-10-CM | POA: Diagnosis not present

## 2020-08-20 DIAGNOSIS — F0391 Unspecified dementia with behavioral disturbance: Secondary | ICD-10-CM | POA: Diagnosis not present

## 2020-08-20 DIAGNOSIS — Z7401 Bed confinement status: Secondary | ICD-10-CM | POA: Diagnosis not present

## 2020-08-20 DIAGNOSIS — I480 Paroxysmal atrial fibrillation: Secondary | ICD-10-CM | POA: Diagnosis not present

## 2020-08-20 DIAGNOSIS — Z955 Presence of coronary angioplasty implant and graft: Secondary | ICD-10-CM | POA: Diagnosis not present

## 2020-08-20 DIAGNOSIS — J181 Lobar pneumonia, unspecified organism: Secondary | ICD-10-CM | POA: Diagnosis not present

## 2020-08-20 DIAGNOSIS — Z96698 Presence of other orthopedic joint implants: Secondary | ICD-10-CM | POA: Diagnosis not present

## 2020-08-20 DIAGNOSIS — F329 Major depressive disorder, single episode, unspecified: Secondary | ICD-10-CM | POA: Diagnosis not present

## 2020-08-20 DIAGNOSIS — R0600 Dyspnea, unspecified: Secondary | ICD-10-CM | POA: Diagnosis not present

## 2020-08-20 DIAGNOSIS — M2550 Pain in unspecified joint: Secondary | ICD-10-CM | POA: Diagnosis not present

## 2020-08-20 DIAGNOSIS — H9193 Unspecified hearing loss, bilateral: Secondary | ICD-10-CM | POA: Diagnosis not present

## 2020-08-20 DIAGNOSIS — D649 Anemia, unspecified: Secondary | ICD-10-CM | POA: Diagnosis not present

## 2020-08-20 DIAGNOSIS — Z7901 Long term (current) use of anticoagulants: Secondary | ICD-10-CM | POA: Diagnosis not present

## 2020-08-20 DIAGNOSIS — R0902 Hypoxemia: Secondary | ICD-10-CM | POA: Diagnosis not present

## 2020-08-20 DIAGNOSIS — F32A Depression, unspecified: Secondary | ICD-10-CM | POA: Diagnosis not present

## 2020-08-20 DIAGNOSIS — R5381 Other malaise: Secondary | ICD-10-CM | POA: Diagnosis not present

## 2020-08-20 DIAGNOSIS — D509 Iron deficiency anemia, unspecified: Secondary | ICD-10-CM | POA: Diagnosis not present

## 2020-08-20 DIAGNOSIS — I350 Nonrheumatic aortic (valve) stenosis: Secondary | ICD-10-CM | POA: Diagnosis not present

## 2020-08-20 DIAGNOSIS — M542 Cervicalgia: Secondary | ICD-10-CM | POA: Diagnosis not present

## 2020-08-20 DIAGNOSIS — Z8781 Personal history of (healed) traumatic fracture: Secondary | ICD-10-CM | POA: Diagnosis not present

## 2020-08-20 DIAGNOSIS — B351 Tinea unguium: Secondary | ICD-10-CM | POA: Diagnosis not present

## 2020-08-20 DIAGNOSIS — S12112D Nondisplaced Type II dens fracture, subsequent encounter for fracture with routine healing: Secondary | ICD-10-CM | POA: Diagnosis not present

## 2020-08-20 DIAGNOSIS — M545 Low back pain, unspecified: Secondary | ICD-10-CM | POA: Diagnosis not present

## 2020-08-20 DIAGNOSIS — R2681 Unsteadiness on feet: Secondary | ICD-10-CM | POA: Diagnosis not present

## 2020-08-20 DIAGNOSIS — G8929 Other chronic pain: Secondary | ICD-10-CM | POA: Diagnosis not present

## 2020-08-20 DIAGNOSIS — R630 Anorexia: Secondary | ICD-10-CM | POA: Diagnosis not present

## 2020-08-20 DIAGNOSIS — K5900 Constipation, unspecified: Secondary | ICD-10-CM | POA: Diagnosis not present

## 2020-08-21 DIAGNOSIS — K5900 Constipation, unspecified: Secondary | ICD-10-CM | POA: Diagnosis not present

## 2020-08-21 DIAGNOSIS — M545 Low back pain, unspecified: Secondary | ICD-10-CM | POA: Diagnosis not present

## 2020-08-21 DIAGNOSIS — R5381 Other malaise: Secondary | ICD-10-CM | POA: Diagnosis not present

## 2020-08-21 DIAGNOSIS — I482 Chronic atrial fibrillation, unspecified: Secondary | ICD-10-CM | POA: Diagnosis not present

## 2020-08-21 DIAGNOSIS — I2510 Atherosclerotic heart disease of native coronary artery without angina pectoris: Secondary | ICD-10-CM | POA: Diagnosis not present

## 2020-08-21 DIAGNOSIS — M542 Cervicalgia: Secondary | ICD-10-CM | POA: Diagnosis not present

## 2020-08-21 DIAGNOSIS — R0600 Dyspnea, unspecified: Secondary | ICD-10-CM | POA: Diagnosis not present

## 2020-08-21 DIAGNOSIS — J181 Lobar pneumonia, unspecified organism: Secondary | ICD-10-CM | POA: Diagnosis not present

## 2020-08-21 DIAGNOSIS — E46 Unspecified protein-calorie malnutrition: Secondary | ICD-10-CM | POA: Diagnosis not present

## 2020-08-21 DIAGNOSIS — I700 Atherosclerosis of aorta: Secondary | ICD-10-CM | POA: Diagnosis not present

## 2020-09-01 DIAGNOSIS — F411 Generalized anxiety disorder: Secondary | ICD-10-CM | POA: Diagnosis not present

## 2020-09-01 DIAGNOSIS — F32A Depression, unspecified: Secondary | ICD-10-CM | POA: Diagnosis not present

## 2020-09-01 DIAGNOSIS — F0391 Unspecified dementia with behavioral disturbance: Secondary | ICD-10-CM | POA: Diagnosis not present

## 2020-09-06 ENCOUNTER — Other Ambulatory Visit: Payer: Self-pay | Admitting: *Deleted

## 2020-09-06 NOTE — Patient Outreach (Signed)
Member screened for potential [facility] Care [facility] needs.  Spoke with [facility] SW who reports member will return to long term care after skilled therapy.   No identifiable [facility] needs at this time.   Marthenia Rolling, MSN, RN,BSN [facility] Coordinator [phone] [facility]) [phone]  (Toll free office)

## 2020-09-08 DIAGNOSIS — R630 Anorexia: Secondary | ICD-10-CM | POA: Diagnosis not present

## 2020-09-08 DIAGNOSIS — E46 Unspecified protein-calorie malnutrition: Secondary | ICD-10-CM | POA: Diagnosis not present

## 2020-09-08 DIAGNOSIS — G8929 Other chronic pain: Secondary | ICD-10-CM | POA: Diagnosis not present

## 2020-09-14 DIAGNOSIS — K219 Gastro-esophageal reflux disease without esophagitis: Secondary | ICD-10-CM | POA: Diagnosis not present

## 2020-09-14 DIAGNOSIS — R1310 Dysphagia, unspecified: Secondary | ICD-10-CM | POA: Diagnosis not present

## 2020-09-14 DIAGNOSIS — R2681 Unsteadiness on feet: Secondary | ICD-10-CM | POA: Diagnosis not present

## 2020-09-14 DIAGNOSIS — J9601 Acute respiratory failure with hypoxia: Secondary | ICD-10-CM | POA: Diagnosis not present

## 2020-09-14 DIAGNOSIS — F0391 Unspecified dementia with behavioral disturbance: Secondary | ICD-10-CM | POA: Diagnosis not present

## 2020-09-14 DIAGNOSIS — G8929 Other chronic pain: Secondary | ICD-10-CM | POA: Diagnosis not present

## 2020-09-14 DIAGNOSIS — I509 Heart failure, unspecified: Secondary | ICD-10-CM | POA: Diagnosis not present

## 2020-09-14 DIAGNOSIS — M6281 Muscle weakness (generalized): Secondary | ICD-10-CM | POA: Diagnosis not present

## 2020-09-15 DIAGNOSIS — F411 Generalized anxiety disorder: Secondary | ICD-10-CM | POA: Diagnosis not present

## 2020-09-15 DIAGNOSIS — F0391 Unspecified dementia with behavioral disturbance: Secondary | ICD-10-CM | POA: Diagnosis not present

## 2020-09-15 DIAGNOSIS — F32A Depression, unspecified: Secondary | ICD-10-CM | POA: Diagnosis not present

## 2020-09-16 DIAGNOSIS — J9601 Acute respiratory failure with hypoxia: Secondary | ICD-10-CM | POA: Diagnosis not present

## 2020-09-16 DIAGNOSIS — M6281 Muscle weakness (generalized): Secondary | ICD-10-CM | POA: Diagnosis not present

## 2020-09-16 DIAGNOSIS — G8929 Other chronic pain: Secondary | ICD-10-CM | POA: Diagnosis not present

## 2020-09-16 DIAGNOSIS — I509 Heart failure, unspecified: Secondary | ICD-10-CM | POA: Diagnosis not present

## 2020-09-16 DIAGNOSIS — R2681 Unsteadiness on feet: Secondary | ICD-10-CM | POA: Diagnosis not present

## 2020-09-16 DIAGNOSIS — R1310 Dysphagia, unspecified: Secondary | ICD-10-CM | POA: Diagnosis not present

## 2020-09-17 DIAGNOSIS — F0391 Unspecified dementia with behavioral disturbance: Secondary | ICD-10-CM | POA: Diagnosis not present

## 2020-09-17 DIAGNOSIS — F32A Depression, unspecified: Secondary | ICD-10-CM | POA: Diagnosis not present

## 2020-09-17 DIAGNOSIS — R443 Hallucinations, unspecified: Secondary | ICD-10-CM | POA: Diagnosis not present

## 2020-09-17 DIAGNOSIS — F4324 Adjustment disorder with disturbance of conduct: Secondary | ICD-10-CM | POA: Diagnosis not present

## 2020-09-17 DIAGNOSIS — F411 Generalized anxiety disorder: Secondary | ICD-10-CM | POA: Diagnosis not present

## 2020-09-20 DIAGNOSIS — R2681 Unsteadiness on feet: Secondary | ICD-10-CM | POA: Diagnosis not present

## 2020-09-20 DIAGNOSIS — J9601 Acute respiratory failure with hypoxia: Secondary | ICD-10-CM | POA: Diagnosis not present

## 2020-09-20 DIAGNOSIS — I509 Heart failure, unspecified: Secondary | ICD-10-CM | POA: Diagnosis not present

## 2020-09-20 DIAGNOSIS — R1310 Dysphagia, unspecified: Secondary | ICD-10-CM | POA: Diagnosis not present

## 2020-09-20 DIAGNOSIS — M6281 Muscle weakness (generalized): Secondary | ICD-10-CM | POA: Diagnosis not present

## 2020-09-20 DIAGNOSIS — G8929 Other chronic pain: Secondary | ICD-10-CM | POA: Diagnosis not present

## 2020-09-22 DIAGNOSIS — R2681 Unsteadiness on feet: Secondary | ICD-10-CM | POA: Diagnosis not present

## 2020-09-22 DIAGNOSIS — G8929 Other chronic pain: Secondary | ICD-10-CM | POA: Diagnosis not present

## 2020-09-22 DIAGNOSIS — M6281 Muscle weakness (generalized): Secondary | ICD-10-CM | POA: Diagnosis not present

## 2020-09-22 DIAGNOSIS — R1310 Dysphagia, unspecified: Secondary | ICD-10-CM | POA: Diagnosis not present

## 2020-09-22 DIAGNOSIS — J9601 Acute respiratory failure with hypoxia: Secondary | ICD-10-CM | POA: Diagnosis not present

## 2020-09-22 DIAGNOSIS — I509 Heart failure, unspecified: Secondary | ICD-10-CM | POA: Diagnosis not present

## 2020-09-23 DIAGNOSIS — R1310 Dysphagia, unspecified: Secondary | ICD-10-CM | POA: Diagnosis not present

## 2020-09-23 DIAGNOSIS — J9601 Acute respiratory failure with hypoxia: Secondary | ICD-10-CM | POA: Diagnosis not present

## 2020-09-23 DIAGNOSIS — I509 Heart failure, unspecified: Secondary | ICD-10-CM | POA: Diagnosis not present

## 2020-09-23 DIAGNOSIS — M6281 Muscle weakness (generalized): Secondary | ICD-10-CM | POA: Diagnosis not present

## 2020-09-23 DIAGNOSIS — G8929 Other chronic pain: Secondary | ICD-10-CM | POA: Diagnosis not present

## 2020-09-23 DIAGNOSIS — R2681 Unsteadiness on feet: Secondary | ICD-10-CM | POA: Diagnosis not present

## 2020-09-24 DIAGNOSIS — F411 Generalized anxiety disorder: Secondary | ICD-10-CM | POA: Diagnosis not present

## 2020-09-24 DIAGNOSIS — R2681 Unsteadiness on feet: Secondary | ICD-10-CM | POA: Diagnosis not present

## 2020-09-24 DIAGNOSIS — I509 Heart failure, unspecified: Secondary | ICD-10-CM | POA: Diagnosis not present

## 2020-09-24 DIAGNOSIS — G8929 Other chronic pain: Secondary | ICD-10-CM | POA: Diagnosis not present

## 2020-09-24 DIAGNOSIS — M6281 Muscle weakness (generalized): Secondary | ICD-10-CM | POA: Diagnosis not present

## 2020-09-24 DIAGNOSIS — R1310 Dysphagia, unspecified: Secondary | ICD-10-CM | POA: Diagnosis not present

## 2020-09-24 DIAGNOSIS — J9601 Acute respiratory failure with hypoxia: Secondary | ICD-10-CM | POA: Diagnosis not present

## 2020-09-24 DIAGNOSIS — R443 Hallucinations, unspecified: Secondary | ICD-10-CM | POA: Diagnosis not present

## 2020-09-24 DIAGNOSIS — F32A Depression, unspecified: Secondary | ICD-10-CM | POA: Diagnosis not present

## 2020-09-24 DIAGNOSIS — F4324 Adjustment disorder with disturbance of conduct: Secondary | ICD-10-CM | POA: Diagnosis not present

## 2020-09-24 DIAGNOSIS — F0391 Unspecified dementia with behavioral disturbance: Secondary | ICD-10-CM | POA: Diagnosis not present

## 2020-09-27 DIAGNOSIS — R2681 Unsteadiness on feet: Secondary | ICD-10-CM | POA: Diagnosis not present

## 2020-09-27 DIAGNOSIS — G8929 Other chronic pain: Secondary | ICD-10-CM | POA: Diagnosis not present

## 2020-09-27 DIAGNOSIS — R1310 Dysphagia, unspecified: Secondary | ICD-10-CM | POA: Diagnosis not present

## 2020-09-27 DIAGNOSIS — I509 Heart failure, unspecified: Secondary | ICD-10-CM | POA: Diagnosis not present

## 2020-09-27 DIAGNOSIS — M6281 Muscle weakness (generalized): Secondary | ICD-10-CM | POA: Diagnosis not present

## 2020-09-27 DIAGNOSIS — J9601 Acute respiratory failure with hypoxia: Secondary | ICD-10-CM | POA: Diagnosis not present

## 2020-09-28 DIAGNOSIS — R2681 Unsteadiness on feet: Secondary | ICD-10-CM | POA: Diagnosis not present

## 2020-09-28 DIAGNOSIS — I509 Heart failure, unspecified: Secondary | ICD-10-CM | POA: Diagnosis not present

## 2020-09-28 DIAGNOSIS — G8929 Other chronic pain: Secondary | ICD-10-CM | POA: Diagnosis not present

## 2020-09-28 DIAGNOSIS — R1310 Dysphagia, unspecified: Secondary | ICD-10-CM | POA: Diagnosis not present

## 2020-09-28 DIAGNOSIS — M6281 Muscle weakness (generalized): Secondary | ICD-10-CM | POA: Diagnosis not present

## 2020-09-28 DIAGNOSIS — J9601 Acute respiratory failure with hypoxia: Secondary | ICD-10-CM | POA: Diagnosis not present

## 2020-09-29 DIAGNOSIS — J9601 Acute respiratory failure with hypoxia: Secondary | ICD-10-CM | POA: Diagnosis not present

## 2020-09-29 DIAGNOSIS — M6281 Muscle weakness (generalized): Secondary | ICD-10-CM | POA: Diagnosis not present

## 2020-09-29 DIAGNOSIS — R1310 Dysphagia, unspecified: Secondary | ICD-10-CM | POA: Diagnosis not present

## 2020-09-29 DIAGNOSIS — R2681 Unsteadiness on feet: Secondary | ICD-10-CM | POA: Diagnosis not present

## 2020-09-29 DIAGNOSIS — G8929 Other chronic pain: Secondary | ICD-10-CM | POA: Diagnosis not present

## 2020-09-29 DIAGNOSIS — I509 Heart failure, unspecified: Secondary | ICD-10-CM | POA: Diagnosis not present

## 2020-09-30 DIAGNOSIS — R1310 Dysphagia, unspecified: Secondary | ICD-10-CM | POA: Diagnosis not present

## 2020-09-30 DIAGNOSIS — M6281 Muscle weakness (generalized): Secondary | ICD-10-CM | POA: Diagnosis not present

## 2020-09-30 DIAGNOSIS — R2681 Unsteadiness on feet: Secondary | ICD-10-CM | POA: Diagnosis not present

## 2020-09-30 DIAGNOSIS — I509 Heart failure, unspecified: Secondary | ICD-10-CM | POA: Diagnosis not present

## 2020-09-30 DIAGNOSIS — G8929 Other chronic pain: Secondary | ICD-10-CM | POA: Diagnosis not present

## 2020-09-30 DIAGNOSIS — J9601 Acute respiratory failure with hypoxia: Secondary | ICD-10-CM | POA: Diagnosis not present

## 2020-10-01 DIAGNOSIS — G8929 Other chronic pain: Secondary | ICD-10-CM | POA: Diagnosis not present

## 2020-10-01 DIAGNOSIS — R1310 Dysphagia, unspecified: Secondary | ICD-10-CM | POA: Diagnosis not present

## 2020-10-01 DIAGNOSIS — J9601 Acute respiratory failure with hypoxia: Secondary | ICD-10-CM | POA: Diagnosis not present

## 2020-10-01 DIAGNOSIS — I509 Heart failure, unspecified: Secondary | ICD-10-CM | POA: Diagnosis not present

## 2020-10-01 DIAGNOSIS — M6281 Muscle weakness (generalized): Secondary | ICD-10-CM | POA: Diagnosis not present

## 2020-10-01 DIAGNOSIS — R2681 Unsteadiness on feet: Secondary | ICD-10-CM | POA: Diagnosis not present

## 2020-10-04 DIAGNOSIS — I509 Heart failure, unspecified: Secondary | ICD-10-CM | POA: Diagnosis not present

## 2020-10-04 DIAGNOSIS — J9601 Acute respiratory failure with hypoxia: Secondary | ICD-10-CM | POA: Diagnosis not present

## 2020-10-04 DIAGNOSIS — G8929 Other chronic pain: Secondary | ICD-10-CM | POA: Diagnosis not present

## 2020-10-04 DIAGNOSIS — R2681 Unsteadiness on feet: Secondary | ICD-10-CM | POA: Diagnosis not present

## 2020-10-04 DIAGNOSIS — M6281 Muscle weakness (generalized): Secondary | ICD-10-CM | POA: Diagnosis not present

## 2020-10-04 DIAGNOSIS — R1310 Dysphagia, unspecified: Secondary | ICD-10-CM | POA: Diagnosis not present

## 2020-10-05 DIAGNOSIS — F0391 Unspecified dementia with behavioral disturbance: Secondary | ICD-10-CM | POA: Diagnosis not present

## 2020-10-05 DIAGNOSIS — K219 Gastro-esophageal reflux disease without esophagitis: Secondary | ICD-10-CM | POA: Diagnosis not present

## 2020-10-05 DIAGNOSIS — J9601 Acute respiratory failure with hypoxia: Secondary | ICD-10-CM | POA: Diagnosis not present

## 2020-10-05 DIAGNOSIS — R2681 Unsteadiness on feet: Secondary | ICD-10-CM | POA: Diagnosis not present

## 2020-10-05 DIAGNOSIS — I509 Heart failure, unspecified: Secondary | ICD-10-CM | POA: Diagnosis not present

## 2020-10-05 DIAGNOSIS — M6281 Muscle weakness (generalized): Secondary | ICD-10-CM | POA: Diagnosis not present

## 2020-10-05 DIAGNOSIS — G8929 Other chronic pain: Secondary | ICD-10-CM | POA: Diagnosis not present

## 2020-10-05 DIAGNOSIS — R1310 Dysphagia, unspecified: Secondary | ICD-10-CM | POA: Diagnosis not present

## 2020-10-06 DIAGNOSIS — R1310 Dysphagia, unspecified: Secondary | ICD-10-CM | POA: Diagnosis not present

## 2020-10-06 DIAGNOSIS — I509 Heart failure, unspecified: Secondary | ICD-10-CM | POA: Diagnosis not present

## 2020-10-06 DIAGNOSIS — G8929 Other chronic pain: Secondary | ICD-10-CM | POA: Diagnosis not present

## 2020-10-06 DIAGNOSIS — R2681 Unsteadiness on feet: Secondary | ICD-10-CM | POA: Diagnosis not present

## 2020-10-06 DIAGNOSIS — M6281 Muscle weakness (generalized): Secondary | ICD-10-CM | POA: Diagnosis not present

## 2020-10-06 DIAGNOSIS — J9601 Acute respiratory failure with hypoxia: Secondary | ICD-10-CM | POA: Diagnosis not present

## 2020-10-08 DIAGNOSIS — J9601 Acute respiratory failure with hypoxia: Secondary | ICD-10-CM | POA: Diagnosis not present

## 2020-10-08 DIAGNOSIS — R1310 Dysphagia, unspecified: Secondary | ICD-10-CM | POA: Diagnosis not present

## 2020-10-08 DIAGNOSIS — R2681 Unsteadiness on feet: Secondary | ICD-10-CM | POA: Diagnosis not present

## 2020-10-08 DIAGNOSIS — G8929 Other chronic pain: Secondary | ICD-10-CM | POA: Diagnosis not present

## 2020-10-08 DIAGNOSIS — M6281 Muscle weakness (generalized): Secondary | ICD-10-CM | POA: Diagnosis not present

## 2020-10-08 DIAGNOSIS — I509 Heart failure, unspecified: Secondary | ICD-10-CM | POA: Diagnosis not present

## 2020-10-11 DIAGNOSIS — G8929 Other chronic pain: Secondary | ICD-10-CM | POA: Diagnosis not present

## 2020-10-11 DIAGNOSIS — I509 Heart failure, unspecified: Secondary | ICD-10-CM | POA: Diagnosis not present

## 2020-10-11 DIAGNOSIS — R1310 Dysphagia, unspecified: Secondary | ICD-10-CM | POA: Diagnosis not present

## 2020-10-11 DIAGNOSIS — J9601 Acute respiratory failure with hypoxia: Secondary | ICD-10-CM | POA: Diagnosis not present

## 2020-10-11 DIAGNOSIS — M6281 Muscle weakness (generalized): Secondary | ICD-10-CM | POA: Diagnosis not present

## 2020-10-11 DIAGNOSIS — R2681 Unsteadiness on feet: Secondary | ICD-10-CM | POA: Diagnosis not present

## 2020-10-27 DIAGNOSIS — F411 Generalized anxiety disorder: Secondary | ICD-10-CM | POA: Diagnosis not present

## 2020-10-27 DIAGNOSIS — F0391 Unspecified dementia with behavioral disturbance: Secondary | ICD-10-CM | POA: Diagnosis not present

## 2020-10-27 DIAGNOSIS — F32A Depression, unspecified: Secondary | ICD-10-CM | POA: Diagnosis not present

## 2020-10-27 DIAGNOSIS — F4321 Adjustment disorder with depressed mood: Secondary | ICD-10-CM | POA: Diagnosis not present

## 2020-10-29 DIAGNOSIS — R5381 Other malaise: Secondary | ICD-10-CM | POA: Diagnosis not present

## 2020-10-29 DIAGNOSIS — D649 Anemia, unspecified: Secondary | ICD-10-CM | POA: Diagnosis not present

## 2020-10-29 DIAGNOSIS — I2510 Atherosclerotic heart disease of native coronary artery without angina pectoris: Secondary | ICD-10-CM | POA: Diagnosis not present

## 2020-10-29 DIAGNOSIS — M810 Age-related osteoporosis without current pathological fracture: Secondary | ICD-10-CM | POA: Diagnosis not present

## 2020-11-18 DIAGNOSIS — I509 Heart failure, unspecified: Secondary | ICD-10-CM | POA: Diagnosis not present

## 2020-11-18 DIAGNOSIS — D649 Anemia, unspecified: Secondary | ICD-10-CM | POA: Diagnosis not present

## 2020-11-18 DIAGNOSIS — R059 Cough, unspecified: Secondary | ICD-10-CM | POA: Diagnosis not present

## 2020-11-23 DIAGNOSIS — I110 Hypertensive heart disease with heart failure: Secondary | ICD-10-CM | POA: Diagnosis present

## 2020-11-23 DIAGNOSIS — I083 Combined rheumatic disorders of mitral, aortic and tricuspid valves: Secondary | ICD-10-CM | POA: Diagnosis present

## 2020-11-23 DIAGNOSIS — E222 Syndrome of inappropriate secretion of antidiuretic hormone: Secondary | ICD-10-CM | POA: Diagnosis present

## 2020-11-23 DIAGNOSIS — J189 Pneumonia, unspecified organism: Secondary | ICD-10-CM | POA: Diagnosis not present

## 2020-11-23 DIAGNOSIS — G4489 Other headache syndrome: Secondary | ICD-10-CM | POA: Diagnosis not present

## 2020-11-23 DIAGNOSIS — M1990 Unspecified osteoarthritis, unspecified site: Secondary | ICD-10-CM | POA: Diagnosis present

## 2020-11-23 DIAGNOSIS — R0902 Hypoxemia: Secondary | ICD-10-CM | POA: Diagnosis not present

## 2020-11-23 DIAGNOSIS — Z9981 Dependence on supplemental oxygen: Secondary | ICD-10-CM | POA: Diagnosis not present

## 2020-11-23 DIAGNOSIS — I361 Nonrheumatic tricuspid (valve) insufficiency: Secondary | ICD-10-CM | POA: Diagnosis not present

## 2020-11-23 DIAGNOSIS — I2510 Atherosclerotic heart disease of native coronary artery without angina pectoris: Secondary | ICD-10-CM | POA: Diagnosis present

## 2020-11-23 DIAGNOSIS — I480 Paroxysmal atrial fibrillation: Secondary | ICD-10-CM | POA: Diagnosis present

## 2020-11-23 DIAGNOSIS — I5031 Acute diastolic (congestive) heart failure: Secondary | ICD-10-CM | POA: Diagnosis present

## 2020-11-23 DIAGNOSIS — J80 Acute respiratory distress syndrome: Secondary | ICD-10-CM | POA: Diagnosis not present

## 2020-11-23 DIAGNOSIS — R279 Unspecified lack of coordination: Secondary | ICD-10-CM | POA: Diagnosis not present

## 2020-11-23 DIAGNOSIS — R069 Unspecified abnormalities of breathing: Secondary | ICD-10-CM | POA: Diagnosis not present

## 2020-11-23 DIAGNOSIS — D638 Anemia in other chronic diseases classified elsewhere: Secondary | ICD-10-CM | POA: Diagnosis present

## 2020-11-23 DIAGNOSIS — R0689 Other abnormalities of breathing: Secondary | ICD-10-CM | POA: Diagnosis not present

## 2020-11-23 DIAGNOSIS — A419 Sepsis, unspecified organism: Secondary | ICD-10-CM | POA: Diagnosis present

## 2020-11-23 DIAGNOSIS — K219 Gastro-esophageal reflux disease without esophagitis: Secondary | ICD-10-CM | POA: Diagnosis present

## 2020-11-23 DIAGNOSIS — Z66 Do not resuscitate: Secondary | ICD-10-CM | POA: Diagnosis present

## 2020-11-23 DIAGNOSIS — J90 Pleural effusion, not elsewhere classified: Secondary | ICD-10-CM | POA: Diagnosis not present

## 2020-11-23 DIAGNOSIS — D696 Thrombocytopenia, unspecified: Secondary | ICD-10-CM | POA: Diagnosis present

## 2020-11-23 DIAGNOSIS — J9601 Acute respiratory failure with hypoxia: Secondary | ICD-10-CM | POA: Diagnosis present

## 2020-11-23 DIAGNOSIS — R0602 Shortness of breath: Secondary | ICD-10-CM | POA: Diagnosis not present

## 2020-11-23 DIAGNOSIS — E782 Mixed hyperlipidemia: Secondary | ICD-10-CM | POA: Diagnosis present

## 2020-11-23 DIAGNOSIS — E440 Moderate protein-calorie malnutrition: Secondary | ICD-10-CM | POA: Diagnosis present

## 2020-11-23 DIAGNOSIS — R6520 Severe sepsis without septic shock: Secondary | ICD-10-CM | POA: Diagnosis present

## 2020-11-23 DIAGNOSIS — R0603 Acute respiratory distress: Secondary | ICD-10-CM | POA: Diagnosis not present

## 2020-11-23 DIAGNOSIS — J159 Unspecified bacterial pneumonia: Secondary | ICD-10-CM | POA: Diagnosis present

## 2020-11-23 DIAGNOSIS — J9811 Atelectasis: Secondary | ICD-10-CM | POA: Diagnosis not present

## 2020-11-23 DIAGNOSIS — I272 Pulmonary hypertension, unspecified: Secondary | ICD-10-CM | POA: Diagnosis present

## 2020-11-23 DIAGNOSIS — I517 Cardiomegaly: Secondary | ICD-10-CM | POA: Diagnosis not present

## 2020-11-23 DIAGNOSIS — I340 Nonrheumatic mitral (valve) insufficiency: Secondary | ICD-10-CM | POA: Diagnosis not present

## 2020-11-23 DIAGNOSIS — E7800 Pure hypercholesterolemia, unspecified: Secondary | ICD-10-CM | POA: Diagnosis present

## 2020-11-23 DIAGNOSIS — J101 Influenza due to other identified influenza virus with other respiratory manifestations: Secondary | ICD-10-CM | POA: Diagnosis not present

## 2020-11-23 DIAGNOSIS — J811 Chronic pulmonary edema: Secondary | ICD-10-CM | POA: Diagnosis not present

## 2020-11-23 DIAGNOSIS — Z7982 Long term (current) use of aspirin: Secondary | ICD-10-CM | POA: Diagnosis not present

## 2020-11-23 DIAGNOSIS — Z79899 Other long term (current) drug therapy: Secondary | ICD-10-CM | POA: Diagnosis not present

## 2020-11-23 DIAGNOSIS — F0390 Unspecified dementia without behavioral disturbance: Secondary | ICD-10-CM | POA: Diagnosis present

## 2020-11-23 DIAGNOSIS — J1000 Influenza due to other identified influenza virus with unspecified type of pneumonia: Secondary | ICD-10-CM | POA: Diagnosis not present

## 2020-11-23 DIAGNOSIS — M810 Age-related osteoporosis without current pathological fracture: Secondary | ICD-10-CM | POA: Diagnosis present

## 2020-11-23 DIAGNOSIS — J9690 Respiratory failure, unspecified, unspecified whether with hypoxia or hypercapnia: Secondary | ICD-10-CM | POA: Diagnosis not present

## 2020-12-01 DIAGNOSIS — R1312 Dysphagia, oropharyngeal phase: Secondary | ICD-10-CM | POA: Diagnosis not present

## 2020-12-01 DIAGNOSIS — R2689 Other abnormalities of gait and mobility: Secondary | ICD-10-CM | POA: Diagnosis not present

## 2020-12-01 DIAGNOSIS — J101 Influenza due to other identified influenza virus with other respiratory manifestations: Secondary | ICD-10-CM | POA: Diagnosis not present

## 2020-12-01 DIAGNOSIS — I5031 Acute diastolic (congestive) heart failure: Secondary | ICD-10-CM | POA: Diagnosis not present

## 2020-12-01 DIAGNOSIS — A419 Sepsis, unspecified organism: Secondary | ICD-10-CM | POA: Diagnosis not present

## 2020-12-01 DIAGNOSIS — I5032 Chronic diastolic (congestive) heart failure: Secondary | ICD-10-CM | POA: Diagnosis not present

## 2020-12-02 DIAGNOSIS — R1312 Dysphagia, oropharyngeal phase: Secondary | ICD-10-CM | POA: Diagnosis not present

## 2020-12-02 DIAGNOSIS — J9601 Acute respiratory failure with hypoxia: Secondary | ICD-10-CM | POA: Diagnosis not present

## 2020-12-02 DIAGNOSIS — I5031 Acute diastolic (congestive) heart failure: Secondary | ICD-10-CM | POA: Diagnosis not present

## 2020-12-02 DIAGNOSIS — I5032 Chronic diastolic (congestive) heart failure: Secondary | ICD-10-CM | POA: Diagnosis not present

## 2020-12-02 DIAGNOSIS — J189 Pneumonia, unspecified organism: Secondary | ICD-10-CM | POA: Diagnosis not present

## 2020-12-02 DIAGNOSIS — R2689 Other abnormalities of gait and mobility: Secondary | ICD-10-CM | POA: Diagnosis not present

## 2020-12-02 DIAGNOSIS — A419 Sepsis, unspecified organism: Secondary | ICD-10-CM | POA: Diagnosis not present

## 2020-12-02 DIAGNOSIS — J101 Influenza due to other identified influenza virus with other respiratory manifestations: Secondary | ICD-10-CM | POA: Diagnosis not present

## 2020-12-03 DIAGNOSIS — R2689 Other abnormalities of gait and mobility: Secondary | ICD-10-CM | POA: Diagnosis not present

## 2020-12-03 DIAGNOSIS — A419 Sepsis, unspecified organism: Secondary | ICD-10-CM | POA: Diagnosis not present

## 2020-12-03 DIAGNOSIS — J101 Influenza due to other identified influenza virus with other respiratory manifestations: Secondary | ICD-10-CM | POA: Diagnosis not present

## 2020-12-03 DIAGNOSIS — I5031 Acute diastolic (congestive) heart failure: Secondary | ICD-10-CM | POA: Diagnosis not present

## 2020-12-03 DIAGNOSIS — I5032 Chronic diastolic (congestive) heart failure: Secondary | ICD-10-CM | POA: Diagnosis not present

## 2020-12-03 DIAGNOSIS — R1312 Dysphagia, oropharyngeal phase: Secondary | ICD-10-CM | POA: Diagnosis not present

## 2020-12-09 DIAGNOSIS — I5031 Acute diastolic (congestive) heart failure: Secondary | ICD-10-CM | POA: Diagnosis not present

## 2020-12-09 DIAGNOSIS — I5032 Chronic diastolic (congestive) heart failure: Secondary | ICD-10-CM | POA: Diagnosis not present

## 2020-12-09 DIAGNOSIS — R1312 Dysphagia, oropharyngeal phase: Secondary | ICD-10-CM | POA: Diagnosis not present

## 2020-12-09 DIAGNOSIS — J101 Influenza due to other identified influenza virus with other respiratory manifestations: Secondary | ICD-10-CM | POA: Diagnosis not present

## 2020-12-09 DIAGNOSIS — A419 Sepsis, unspecified organism: Secondary | ICD-10-CM | POA: Diagnosis not present

## 2020-12-09 DIAGNOSIS — R2689 Other abnormalities of gait and mobility: Secondary | ICD-10-CM | POA: Diagnosis not present

## 2020-12-09 DIAGNOSIS — M6281 Muscle weakness (generalized): Secondary | ICD-10-CM | POA: Diagnosis not present

## 2020-12-11 DIAGNOSIS — M6281 Muscle weakness (generalized): Secondary | ICD-10-CM | POA: Diagnosis not present

## 2020-12-11 DIAGNOSIS — I5032 Chronic diastolic (congestive) heart failure: Secondary | ICD-10-CM | POA: Diagnosis not present

## 2020-12-11 DIAGNOSIS — A419 Sepsis, unspecified organism: Secondary | ICD-10-CM | POA: Diagnosis not present

## 2020-12-11 DIAGNOSIS — R2689 Other abnormalities of gait and mobility: Secondary | ICD-10-CM | POA: Diagnosis not present

## 2020-12-11 DIAGNOSIS — R1312 Dysphagia, oropharyngeal phase: Secondary | ICD-10-CM | POA: Diagnosis not present

## 2020-12-11 DIAGNOSIS — I5031 Acute diastolic (congestive) heart failure: Secondary | ICD-10-CM | POA: Diagnosis not present

## 2020-12-14 DIAGNOSIS — I5032 Chronic diastolic (congestive) heart failure: Secondary | ICD-10-CM | POA: Diagnosis not present

## 2020-12-14 DIAGNOSIS — R1312 Dysphagia, oropharyngeal phase: Secondary | ICD-10-CM | POA: Diagnosis not present

## 2020-12-14 DIAGNOSIS — R2689 Other abnormalities of gait and mobility: Secondary | ICD-10-CM | POA: Diagnosis not present

## 2020-12-14 DIAGNOSIS — M6281 Muscle weakness (generalized): Secondary | ICD-10-CM | POA: Diagnosis not present

## 2020-12-14 DIAGNOSIS — A419 Sepsis, unspecified organism: Secondary | ICD-10-CM | POA: Diagnosis not present

## 2020-12-14 DIAGNOSIS — N390 Urinary tract infection, site not specified: Secondary | ICD-10-CM | POA: Diagnosis not present

## 2020-12-14 DIAGNOSIS — I5031 Acute diastolic (congestive) heart failure: Secondary | ICD-10-CM | POA: Diagnosis not present

## 2020-12-16 DIAGNOSIS — I5031 Acute diastolic (congestive) heart failure: Secondary | ICD-10-CM | POA: Diagnosis not present

## 2020-12-16 DIAGNOSIS — A419 Sepsis, unspecified organism: Secondary | ICD-10-CM | POA: Diagnosis not present

## 2020-12-16 DIAGNOSIS — R2689 Other abnormalities of gait and mobility: Secondary | ICD-10-CM | POA: Diagnosis not present

## 2020-12-16 DIAGNOSIS — R1312 Dysphagia, oropharyngeal phase: Secondary | ICD-10-CM | POA: Diagnosis not present

## 2020-12-16 DIAGNOSIS — M6281 Muscle weakness (generalized): Secondary | ICD-10-CM | POA: Diagnosis not present

## 2020-12-16 DIAGNOSIS — I5032 Chronic diastolic (congestive) heart failure: Secondary | ICD-10-CM | POA: Diagnosis not present

## 2020-12-20 DIAGNOSIS — I5032 Chronic diastolic (congestive) heart failure: Secondary | ICD-10-CM | POA: Diagnosis not present

## 2020-12-20 DIAGNOSIS — A419 Sepsis, unspecified organism: Secondary | ICD-10-CM | POA: Diagnosis not present

## 2020-12-20 DIAGNOSIS — M6281 Muscle weakness (generalized): Secondary | ICD-10-CM | POA: Diagnosis not present

## 2020-12-20 DIAGNOSIS — R1312 Dysphagia, oropharyngeal phase: Secondary | ICD-10-CM | POA: Diagnosis not present

## 2020-12-20 DIAGNOSIS — I5031 Acute diastolic (congestive) heart failure: Secondary | ICD-10-CM | POA: Diagnosis not present

## 2020-12-20 DIAGNOSIS — R2689 Other abnormalities of gait and mobility: Secondary | ICD-10-CM | POA: Diagnosis not present

## 2020-12-21 DIAGNOSIS — R1312 Dysphagia, oropharyngeal phase: Secondary | ICD-10-CM | POA: Diagnosis not present

## 2020-12-21 DIAGNOSIS — M6281 Muscle weakness (generalized): Secondary | ICD-10-CM | POA: Diagnosis not present

## 2020-12-21 DIAGNOSIS — I5032 Chronic diastolic (congestive) heart failure: Secondary | ICD-10-CM | POA: Diagnosis not present

## 2020-12-21 DIAGNOSIS — A419 Sepsis, unspecified organism: Secondary | ICD-10-CM | POA: Diagnosis not present

## 2020-12-21 DIAGNOSIS — I5031 Acute diastolic (congestive) heart failure: Secondary | ICD-10-CM | POA: Diagnosis not present

## 2020-12-21 DIAGNOSIS — R2689 Other abnormalities of gait and mobility: Secondary | ICD-10-CM | POA: Diagnosis not present

## 2020-12-22 DIAGNOSIS — I5032 Chronic diastolic (congestive) heart failure: Secondary | ICD-10-CM | POA: Diagnosis not present

## 2020-12-22 DIAGNOSIS — M6281 Muscle weakness (generalized): Secondary | ICD-10-CM | POA: Diagnosis not present

## 2020-12-22 DIAGNOSIS — R1312 Dysphagia, oropharyngeal phase: Secondary | ICD-10-CM | POA: Diagnosis not present

## 2020-12-22 DIAGNOSIS — I5031 Acute diastolic (congestive) heart failure: Secondary | ICD-10-CM | POA: Diagnosis not present

## 2020-12-22 DIAGNOSIS — R2689 Other abnormalities of gait and mobility: Secondary | ICD-10-CM | POA: Diagnosis not present

## 2020-12-22 DIAGNOSIS — A419 Sepsis, unspecified organism: Secondary | ICD-10-CM | POA: Diagnosis not present

## 2020-12-24 DIAGNOSIS — A419 Sepsis, unspecified organism: Secondary | ICD-10-CM | POA: Diagnosis not present

## 2020-12-24 DIAGNOSIS — R2689 Other abnormalities of gait and mobility: Secondary | ICD-10-CM | POA: Diagnosis not present

## 2020-12-24 DIAGNOSIS — I5032 Chronic diastolic (congestive) heart failure: Secondary | ICD-10-CM | POA: Diagnosis not present

## 2020-12-24 DIAGNOSIS — R1312 Dysphagia, oropharyngeal phase: Secondary | ICD-10-CM | POA: Diagnosis not present

## 2020-12-24 DIAGNOSIS — M6281 Muscle weakness (generalized): Secondary | ICD-10-CM | POA: Diagnosis not present

## 2020-12-24 DIAGNOSIS — I5031 Acute diastolic (congestive) heart failure: Secondary | ICD-10-CM | POA: Diagnosis not present

## 2020-12-27 DIAGNOSIS — M6281 Muscle weakness (generalized): Secondary | ICD-10-CM | POA: Diagnosis not present

## 2020-12-27 DIAGNOSIS — I5032 Chronic diastolic (congestive) heart failure: Secondary | ICD-10-CM | POA: Diagnosis not present

## 2020-12-27 DIAGNOSIS — R1312 Dysphagia, oropharyngeal phase: Secondary | ICD-10-CM | POA: Diagnosis not present

## 2020-12-27 DIAGNOSIS — A419 Sepsis, unspecified organism: Secondary | ICD-10-CM | POA: Diagnosis not present

## 2020-12-27 DIAGNOSIS — R2689 Other abnormalities of gait and mobility: Secondary | ICD-10-CM | POA: Diagnosis not present

## 2020-12-27 DIAGNOSIS — I5031 Acute diastolic (congestive) heart failure: Secondary | ICD-10-CM | POA: Diagnosis not present

## 2020-12-29 DIAGNOSIS — M6281 Muscle weakness (generalized): Secondary | ICD-10-CM | POA: Diagnosis not present

## 2020-12-29 DIAGNOSIS — R2689 Other abnormalities of gait and mobility: Secondary | ICD-10-CM | POA: Diagnosis not present

## 2020-12-29 DIAGNOSIS — I5032 Chronic diastolic (congestive) heart failure: Secondary | ICD-10-CM | POA: Diagnosis not present

## 2020-12-29 DIAGNOSIS — R1312 Dysphagia, oropharyngeal phase: Secondary | ICD-10-CM | POA: Diagnosis not present

## 2020-12-29 DIAGNOSIS — A419 Sepsis, unspecified organism: Secondary | ICD-10-CM | POA: Diagnosis not present

## 2020-12-29 DIAGNOSIS — I5031 Acute diastolic (congestive) heart failure: Secondary | ICD-10-CM | POA: Diagnosis not present

## 2020-12-31 DIAGNOSIS — A419 Sepsis, unspecified organism: Secondary | ICD-10-CM | POA: Diagnosis not present

## 2020-12-31 DIAGNOSIS — M6281 Muscle weakness (generalized): Secondary | ICD-10-CM | POA: Diagnosis not present

## 2020-12-31 DIAGNOSIS — R2689 Other abnormalities of gait and mobility: Secondary | ICD-10-CM | POA: Diagnosis not present

## 2020-12-31 DIAGNOSIS — I5032 Chronic diastolic (congestive) heart failure: Secondary | ICD-10-CM | POA: Diagnosis not present

## 2020-12-31 DIAGNOSIS — I5031 Acute diastolic (congestive) heart failure: Secondary | ICD-10-CM | POA: Diagnosis not present

## 2020-12-31 DIAGNOSIS — R1312 Dysphagia, oropharyngeal phase: Secondary | ICD-10-CM | POA: Diagnosis not present

## 2021-01-03 DIAGNOSIS — J9611 Chronic respiratory failure with hypoxia: Secondary | ICD-10-CM | POA: Diagnosis not present

## 2021-01-03 DIAGNOSIS — M6281 Muscle weakness (generalized): Secondary | ICD-10-CM | POA: Diagnosis not present

## 2021-01-03 DIAGNOSIS — A419 Sepsis, unspecified organism: Secondary | ICD-10-CM | POA: Diagnosis not present

## 2021-01-03 DIAGNOSIS — F0390 Unspecified dementia without behavioral disturbance: Secondary | ICD-10-CM | POA: Diagnosis not present

## 2021-01-03 DIAGNOSIS — I5031 Acute diastolic (congestive) heart failure: Secondary | ICD-10-CM | POA: Diagnosis not present

## 2021-01-03 DIAGNOSIS — I5032 Chronic diastolic (congestive) heart failure: Secondary | ICD-10-CM | POA: Diagnosis not present

## 2021-01-03 DIAGNOSIS — I739 Peripheral vascular disease, unspecified: Secondary | ICD-10-CM | POA: Diagnosis not present

## 2021-01-03 DIAGNOSIS — R1312 Dysphagia, oropharyngeal phase: Secondary | ICD-10-CM | POA: Diagnosis not present

## 2021-01-03 DIAGNOSIS — R2689 Other abnormalities of gait and mobility: Secondary | ICD-10-CM | POA: Diagnosis not present

## 2021-01-03 DIAGNOSIS — R5381 Other malaise: Secondary | ICD-10-CM | POA: Diagnosis not present

## 2021-01-05 DIAGNOSIS — R1312 Dysphagia, oropharyngeal phase: Secondary | ICD-10-CM | POA: Diagnosis not present

## 2021-01-05 DIAGNOSIS — J101 Influenza due to other identified influenza virus with other respiratory manifestations: Secondary | ICD-10-CM | POA: Diagnosis not present

## 2021-01-05 DIAGNOSIS — I5032 Chronic diastolic (congestive) heart failure: Secondary | ICD-10-CM | POA: Diagnosis not present

## 2021-01-05 DIAGNOSIS — A419 Sepsis, unspecified organism: Secondary | ICD-10-CM | POA: Diagnosis not present

## 2021-01-06 DIAGNOSIS — R1312 Dysphagia, oropharyngeal phase: Secondary | ICD-10-CM | POA: Diagnosis not present

## 2021-01-06 DIAGNOSIS — A419 Sepsis, unspecified organism: Secondary | ICD-10-CM | POA: Diagnosis not present

## 2021-01-06 DIAGNOSIS — I5032 Chronic diastolic (congestive) heart failure: Secondary | ICD-10-CM | POA: Diagnosis not present

## 2021-01-06 DIAGNOSIS — J101 Influenza due to other identified influenza virus with other respiratory manifestations: Secondary | ICD-10-CM | POA: Diagnosis not present

## 2021-01-10 DIAGNOSIS — J101 Influenza due to other identified influenza virus with other respiratory manifestations: Secondary | ICD-10-CM | POA: Diagnosis not present

## 2021-01-10 DIAGNOSIS — R1312 Dysphagia, oropharyngeal phase: Secondary | ICD-10-CM | POA: Diagnosis not present

## 2021-01-10 DIAGNOSIS — I5032 Chronic diastolic (congestive) heart failure: Secondary | ICD-10-CM | POA: Diagnosis not present

## 2021-01-10 DIAGNOSIS — A419 Sepsis, unspecified organism: Secondary | ICD-10-CM | POA: Diagnosis not present

## 2021-01-12 DIAGNOSIS — J101 Influenza due to other identified influenza virus with other respiratory manifestations: Secondary | ICD-10-CM | POA: Diagnosis not present

## 2021-01-12 DIAGNOSIS — A419 Sepsis, unspecified organism: Secondary | ICD-10-CM | POA: Diagnosis not present

## 2021-01-12 DIAGNOSIS — I5032 Chronic diastolic (congestive) heart failure: Secondary | ICD-10-CM | POA: Diagnosis not present

## 2021-01-12 DIAGNOSIS — R1312 Dysphagia, oropharyngeal phase: Secondary | ICD-10-CM | POA: Diagnosis not present

## 2021-01-14 DIAGNOSIS — I5032 Chronic diastolic (congestive) heart failure: Secondary | ICD-10-CM | POA: Diagnosis not present

## 2021-01-14 DIAGNOSIS — J101 Influenza due to other identified influenza virus with other respiratory manifestations: Secondary | ICD-10-CM | POA: Diagnosis not present

## 2021-01-14 DIAGNOSIS — A419 Sepsis, unspecified organism: Secondary | ICD-10-CM | POA: Diagnosis not present

## 2021-01-14 DIAGNOSIS — R1312 Dysphagia, oropharyngeal phase: Secondary | ICD-10-CM | POA: Diagnosis not present

## 2021-02-02 DIAGNOSIS — E441 Mild protein-calorie malnutrition: Secondary | ICD-10-CM | POA: Diagnosis not present

## 2021-02-02 DIAGNOSIS — D649 Anemia, unspecified: Secondary | ICD-10-CM | POA: Diagnosis not present

## 2021-02-02 DIAGNOSIS — I4891 Unspecified atrial fibrillation: Secondary | ICD-10-CM | POA: Diagnosis not present

## 2021-02-02 DIAGNOSIS — I5033 Acute on chronic diastolic (congestive) heart failure: Secondary | ICD-10-CM | POA: Diagnosis not present

## 2021-02-07 DIAGNOSIS — M25511 Pain in right shoulder: Secondary | ICD-10-CM | POA: Diagnosis not present

## 2021-02-10 DIAGNOSIS — M75101 Unspecified rotator cuff tear or rupture of right shoulder, not specified as traumatic: Secondary | ICD-10-CM | POA: Diagnosis not present

## 2021-02-10 DIAGNOSIS — M12811 Other specific arthropathies, not elsewhere classified, right shoulder: Secondary | ICD-10-CM | POA: Diagnosis not present

## 2021-02-16 DIAGNOSIS — I509 Heart failure, unspecified: Secondary | ICD-10-CM | POA: Diagnosis not present

## 2021-02-16 DIAGNOSIS — D649 Anemia, unspecified: Secondary | ICD-10-CM | POA: Diagnosis not present

## 2021-02-16 DIAGNOSIS — E559 Vitamin D deficiency, unspecified: Secondary | ICD-10-CM | POA: Diagnosis not present

## 2021-02-16 DIAGNOSIS — I10 Essential (primary) hypertension: Secondary | ICD-10-CM | POA: Diagnosis not present

## 2021-02-16 DIAGNOSIS — E871 Hypo-osmolality and hyponatremia: Secondary | ICD-10-CM | POA: Diagnosis not present

## 2021-02-17 DIAGNOSIS — D649 Anemia, unspecified: Secondary | ICD-10-CM | POA: Diagnosis not present

## 2021-02-17 DIAGNOSIS — I509 Heart failure, unspecified: Secondary | ICD-10-CM | POA: Diagnosis not present

## 2021-02-17 DIAGNOSIS — E559 Vitamin D deficiency, unspecified: Secondary | ICD-10-CM | POA: Diagnosis not present

## 2021-02-17 DIAGNOSIS — I10 Essential (primary) hypertension: Secondary | ICD-10-CM | POA: Diagnosis not present

## 2021-02-22 DIAGNOSIS — R2689 Other abnormalities of gait and mobility: Secondary | ICD-10-CM | POA: Diagnosis not present

## 2021-02-22 DIAGNOSIS — M818 Other osteoporosis without current pathological fracture: Secondary | ICD-10-CM | POA: Diagnosis not present

## 2021-02-23 DIAGNOSIS — M818 Other osteoporosis without current pathological fracture: Secondary | ICD-10-CM | POA: Diagnosis not present

## 2021-02-23 DIAGNOSIS — R2689 Other abnormalities of gait and mobility: Secondary | ICD-10-CM | POA: Diagnosis not present

## 2021-02-26 DIAGNOSIS — M818 Other osteoporosis without current pathological fracture: Secondary | ICD-10-CM | POA: Diagnosis not present

## 2021-02-26 DIAGNOSIS — R2689 Other abnormalities of gait and mobility: Secondary | ICD-10-CM | POA: Diagnosis not present

## 2021-02-28 DIAGNOSIS — M79675 Pain in left toe(s): Secondary | ICD-10-CM | POA: Diagnosis not present

## 2021-02-28 DIAGNOSIS — M2142 Flat foot [pes planus] (acquired), left foot: Secondary | ICD-10-CM | POA: Diagnosis not present

## 2021-02-28 DIAGNOSIS — M79674 Pain in right toe(s): Secondary | ICD-10-CM | POA: Diagnosis not present

## 2021-02-28 DIAGNOSIS — M2141 Flat foot [pes planus] (acquired), right foot: Secondary | ICD-10-CM | POA: Diagnosis not present

## 2021-02-28 DIAGNOSIS — R262 Difficulty in walking, not elsewhere classified: Secondary | ICD-10-CM | POA: Diagnosis not present

## 2021-02-28 DIAGNOSIS — B351 Tinea unguium: Secondary | ICD-10-CM | POA: Diagnosis not present

## 2021-02-28 DIAGNOSIS — I739 Peripheral vascular disease, unspecified: Secondary | ICD-10-CM | POA: Diagnosis not present

## 2021-03-01 DIAGNOSIS — M818 Other osteoporosis without current pathological fracture: Secondary | ICD-10-CM | POA: Diagnosis not present

## 2021-03-01 DIAGNOSIS — R2689 Other abnormalities of gait and mobility: Secondary | ICD-10-CM | POA: Diagnosis not present

## 2021-03-02 DIAGNOSIS — M818 Other osteoporosis without current pathological fracture: Secondary | ICD-10-CM | POA: Diagnosis not present

## 2021-03-02 DIAGNOSIS — R2689 Other abnormalities of gait and mobility: Secondary | ICD-10-CM | POA: Diagnosis not present

## 2021-03-04 DIAGNOSIS — K219 Gastro-esophageal reflux disease without esophagitis: Secondary | ICD-10-CM | POA: Diagnosis not present

## 2021-03-04 DIAGNOSIS — I2510 Atherosclerotic heart disease of native coronary artery without angina pectoris: Secondary | ICD-10-CM | POA: Diagnosis not present

## 2021-03-04 DIAGNOSIS — I119 Hypertensive heart disease without heart failure: Secondary | ICD-10-CM | POA: Diagnosis not present

## 2021-03-04 DIAGNOSIS — F0390 Unspecified dementia without behavioral disturbance: Secondary | ICD-10-CM | POA: Diagnosis not present

## 2021-03-05 DIAGNOSIS — R2689 Other abnormalities of gait and mobility: Secondary | ICD-10-CM | POA: Diagnosis not present

## 2021-03-05 DIAGNOSIS — M818 Other osteoporosis without current pathological fracture: Secondary | ICD-10-CM | POA: Diagnosis not present

## 2021-03-08 DIAGNOSIS — R54 Age-related physical debility: Secondary | ICD-10-CM | POA: Diagnosis not present

## 2021-03-08 DIAGNOSIS — R1312 Dysphagia, oropharyngeal phase: Secondary | ICD-10-CM | POA: Diagnosis not present

## 2021-03-08 DIAGNOSIS — I4891 Unspecified atrial fibrillation: Secondary | ICD-10-CM | POA: Diagnosis not present

## 2021-03-08 DIAGNOSIS — R2689 Other abnormalities of gait and mobility: Secondary | ICD-10-CM | POA: Diagnosis not present

## 2021-03-08 DIAGNOSIS — M818 Other osteoporosis without current pathological fracture: Secondary | ICD-10-CM | POA: Diagnosis not present

## 2021-03-14 DIAGNOSIS — I4821 Permanent atrial fibrillation: Secondary | ICD-10-CM | POA: Diagnosis not present

## 2021-03-14 DIAGNOSIS — Z952 Presence of prosthetic heart valve: Secondary | ICD-10-CM | POA: Diagnosis not present

## 2021-03-14 DIAGNOSIS — I252 Old myocardial infarction: Secondary | ICD-10-CM | POA: Diagnosis not present

## 2021-03-14 DIAGNOSIS — I10 Essential (primary) hypertension: Secondary | ICD-10-CM | POA: Diagnosis not present

## 2021-03-15 DIAGNOSIS — R54 Age-related physical debility: Secondary | ICD-10-CM | POA: Diagnosis not present

## 2021-03-15 DIAGNOSIS — R2689 Other abnormalities of gait and mobility: Secondary | ICD-10-CM | POA: Diagnosis not present

## 2021-03-15 DIAGNOSIS — M818 Other osteoporosis without current pathological fracture: Secondary | ICD-10-CM | POA: Diagnosis not present

## 2021-03-15 DIAGNOSIS — I4891 Unspecified atrial fibrillation: Secondary | ICD-10-CM | POA: Diagnosis not present

## 2021-03-15 DIAGNOSIS — R1312 Dysphagia, oropharyngeal phase: Secondary | ICD-10-CM | POA: Diagnosis not present

## 2021-03-16 DIAGNOSIS — M818 Other osteoporosis without current pathological fracture: Secondary | ICD-10-CM | POA: Diagnosis not present

## 2021-03-16 DIAGNOSIS — R2689 Other abnormalities of gait and mobility: Secondary | ICD-10-CM | POA: Diagnosis not present

## 2021-03-16 DIAGNOSIS — R54 Age-related physical debility: Secondary | ICD-10-CM | POA: Diagnosis not present

## 2021-03-16 DIAGNOSIS — R1312 Dysphagia, oropharyngeal phase: Secondary | ICD-10-CM | POA: Diagnosis not present

## 2021-03-16 DIAGNOSIS — I4891 Unspecified atrial fibrillation: Secondary | ICD-10-CM | POA: Diagnosis not present

## 2021-04-04 DIAGNOSIS — I5032 Chronic diastolic (congestive) heart failure: Secondary | ICD-10-CM | POA: Diagnosis not present

## 2021-04-04 DIAGNOSIS — J9611 Chronic respiratory failure with hypoxia: Secondary | ICD-10-CM | POA: Diagnosis not present

## 2021-04-04 DIAGNOSIS — R5381 Other malaise: Secondary | ICD-10-CM | POA: Diagnosis not present

## 2021-04-04 DIAGNOSIS — M2550 Pain in unspecified joint: Secondary | ICD-10-CM | POA: Diagnosis not present

## 2021-04-15 DIAGNOSIS — I517 Cardiomegaly: Secondary | ICD-10-CM | POA: Diagnosis not present

## 2021-04-15 DIAGNOSIS — I4821 Permanent atrial fibrillation: Secondary | ICD-10-CM | POA: Diagnosis not present

## 2021-04-21 DIAGNOSIS — I444 Left anterior fascicular block: Secondary | ICD-10-CM | POA: Diagnosis not present

## 2021-04-21 DIAGNOSIS — I4821 Permanent atrial fibrillation: Secondary | ICD-10-CM | POA: Diagnosis not present

## 2021-04-21 DIAGNOSIS — Z95818 Presence of other cardiac implants and grafts: Secondary | ICD-10-CM | POA: Diagnosis not present

## 2021-04-21 DIAGNOSIS — I517 Cardiomegaly: Secondary | ICD-10-CM | POA: Diagnosis not present

## 2021-04-21 DIAGNOSIS — I4891 Unspecified atrial fibrillation: Secondary | ICD-10-CM | POA: Diagnosis not present

## 2022-06-07 DEATH — deceased
